# Patient Record
Sex: Female | Born: 1975 | Race: White | Hispanic: No | Marital: Married | Smoking: Never smoker
Health system: Southern US, Community
[De-identification: ages and names within clinical notes are randomized; demographics above are authoritative.]

## PROBLEM LIST (undated history)

## (undated) DIAGNOSIS — Z8619 Personal history of other infectious and parasitic diseases: Secondary | ICD-10-CM

## (undated) DIAGNOSIS — T7840XA Allergy, unspecified, initial encounter: Secondary | ICD-10-CM

## (undated) DIAGNOSIS — F419 Anxiety disorder, unspecified: Secondary | ICD-10-CM

## (undated) HISTORY — DX: Allergy, unspecified, initial encounter: T78.40XA

## (undated) HISTORY — PX: INTRAUTERINE DEVICE (IUD) INSERTION: SHX5877

## (undated) HISTORY — DX: Anxiety disorder, unspecified: F41.9

## (undated) HISTORY — DX: Personal history of other infectious and parasitic diseases: Z86.19

---

## 1997-12-20 HISTORY — PX: WISDOM TOOTH EXTRACTION: SHX21

## 1999-12-21 HISTORY — PX: CHOLECYSTECTOMY: SHX55

## 2012-12-20 LAB — HM PAP SMEAR: HM Pap smear: NEGATIVE

## 2016-03-06 DIAGNOSIS — H60501 Unspecified acute noninfective otitis externa, right ear: Secondary | ICD-10-CM | POA: Diagnosis not present

## 2016-03-06 DIAGNOSIS — J329 Chronic sinusitis, unspecified: Secondary | ICD-10-CM | POA: Diagnosis not present

## 2016-07-07 ENCOUNTER — Telehealth: Payer: Self-pay | Admitting: Family Medicine

## 2016-07-07 MED ORDER — AMOXICILLIN 875 MG PO TABS: 875.0000 mg | ORAL_TABLET | Freq: Two times a day (BID) | ORAL | Status: DC

## 2016-07-07 MED FILL — AMOXICILLIN 875 MG TABLET: 875 | 10 days supply | Qty: 20 | Fill #0

## 2016-07-07 NOTE — Telephone Encounter (Signed)
Pt has hx of recurrent R ear infxns and is again having R ear pain, pressure.  Youngest daughter w/ recent ear infxn.  No drainage from ear.  No fever.  I evaluated pt's R ear prior to clinic today and she does have red, bulging R TM w/ visible purulent fluid collection.  Start Amox.  Reviewed supportive care and red flags that should prompt return.  Pt expressed understanding and is in agreement w/ plan.

## 2016-07-12 DIAGNOSIS — L0211 Cutaneous abscess of neck: Secondary | ICD-10-CM | POA: Diagnosis not present

## 2016-07-12 DIAGNOSIS — L03221 Cellulitis of neck: Secondary | ICD-10-CM | POA: Diagnosis not present

## 2016-07-12 MED FILL — HYDROCODON-APAP 7.5-325: 7.5-325 | 5 days supply | Qty: 30 | Fill #0

## 2016-07-12 MED FILL — CLINDAMYCIN HCL 300 MG CAPS: 300 | 14 days supply | Qty: 42 | Fill #0

## 2016-07-12 MED FILL — FLUCONAZOLE 100 MG TABLET: 100 | 15 days supply | Qty: 5 | Fill #0

## 2016-10-23 DIAGNOSIS — R635 Abnormal weight gain: Secondary | ICD-10-CM | POA: Diagnosis not present

## 2016-10-23 DIAGNOSIS — R632 Polyphagia: Secondary | ICD-10-CM | POA: Diagnosis not present

## 2016-10-31 DIAGNOSIS — B9789 Other viral agents as the cause of diseases classified elsewhere: Secondary | ICD-10-CM | POA: Diagnosis not present

## 2016-10-31 DIAGNOSIS — J069 Acute upper respiratory infection, unspecified: Secondary | ICD-10-CM | POA: Diagnosis not present

## 2016-10-31 DIAGNOSIS — H6981 Other specified disorders of Eustachian tube, right ear: Secondary | ICD-10-CM | POA: Diagnosis not present

## 2017-01-03 ENCOUNTER — Telehealth: Payer: Self-pay | Admitting: Physician Assistant

## 2017-01-03 MED ORDER — AMOXICILLIN 875 MG PO TABS: 875.0000 mg | ORAL_TABLET | Freq: Two times a day (BID) | ORAL | 0 refills | Status: DC

## 2017-01-03 NOTE — Telephone Encounter (Signed)
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 Ear pain x 2 days with pressure. No other symptoms. Denies fever, chills, cough. + history of AOM, twice in past few months, most recently treated by her PCP with Cefdinir.  L TM within normal limits. R TM with erythema, bulging and purulent effusion noted. TM intact.   Rx Amoxicillin sent in.

## 2017-01-13 DIAGNOSIS — J322 Chronic ethmoidal sinusitis: Secondary | ICD-10-CM | POA: Diagnosis not present

## 2017-01-13 DIAGNOSIS — J370 Chronic laryngitis: Secondary | ICD-10-CM | POA: Diagnosis not present

## 2017-01-13 DIAGNOSIS — J039 Acute tonsillitis, unspecified: Secondary | ICD-10-CM | POA: Diagnosis not present

## 2017-01-13 DIAGNOSIS — H6521 Chronic serous otitis media, right ear: Secondary | ICD-10-CM | POA: Diagnosis not present

## 2017-01-13 DIAGNOSIS — J320 Chronic maxillary sinusitis: Secondary | ICD-10-CM | POA: Diagnosis not present

## 2017-01-13 DIAGNOSIS — H7011 Chronic mastoiditis, right ear: Secondary | ICD-10-CM | POA: Diagnosis not present

## 2017-01-13 DIAGNOSIS — H6501 Acute serous otitis media, right ear: Secondary | ICD-10-CM | POA: Diagnosis not present

## 2017-01-13 DIAGNOSIS — H902 Conductive hearing loss, unspecified: Secondary | ICD-10-CM | POA: Diagnosis not present

## 2017-01-13 MED FILL — CLINDAMYCIN HCL 150 MG CAPS: 150 | 10 days supply | Qty: 30 | Fill #0

## 2017-02-03 DIAGNOSIS — J040 Acute laryngitis: Secondary | ICD-10-CM | POA: Diagnosis not present

## 2017-02-03 DIAGNOSIS — J322 Chronic ethmoidal sinusitis: Secondary | ICD-10-CM | POA: Diagnosis not present

## 2017-02-03 DIAGNOSIS — J320 Chronic maxillary sinusitis: Secondary | ICD-10-CM | POA: Diagnosis not present

## 2017-02-03 DIAGNOSIS — H6521 Chronic serous otitis media, right ear: Secondary | ICD-10-CM | POA: Diagnosis not present

## 2017-04-21 DIAGNOSIS — R635 Abnormal weight gain: Secondary | ICD-10-CM | POA: Diagnosis not present

## 2017-04-28 DIAGNOSIS — Z01419 Encounter for gynecological examination (general) (routine) without abnormal findings: Secondary | ICD-10-CM | POA: Diagnosis not present

## 2017-04-28 DIAGNOSIS — E66811 Obesity, class 1: Secondary | ICD-10-CM | POA: Insufficient documentation

## 2017-04-28 DIAGNOSIS — E669 Obesity, unspecified: Secondary | ICD-10-CM | POA: Insufficient documentation

## 2017-08-26 ENCOUNTER — Ambulatory Visit (INDEPENDENT_AMBULATORY_CARE_PROVIDER_SITE_OTHER): Payer: 59

## 2017-08-26 DIAGNOSIS — Z23 Encounter for immunization: Secondary | ICD-10-CM | POA: Diagnosis not present

## 2017-12-28 ENCOUNTER — Ambulatory Visit (INDEPENDENT_AMBULATORY_CARE_PROVIDER_SITE_OTHER): Payer: No Typology Code available for payment source | Admitting: Physician Assistant

## 2017-12-28 ENCOUNTER — Encounter: Payer: Self-pay | Admitting: Physician Assistant

## 2017-12-28 VITALS — BP 110/84 | HR 105 | Temp 98.3°F | Resp 14 | Ht 69.0 in | Wt 235.0 lb

## 2017-12-28 DIAGNOSIS — B9689 Other specified bacterial agents as the cause of diseases classified elsewhere: Secondary | ICD-10-CM

## 2017-12-28 DIAGNOSIS — J019 Acute sinusitis, unspecified: Secondary | ICD-10-CM

## 2017-12-28 MED ORDER — BECLOMETHASONE DIPROPIONATE 80 MCG/ACT NA AERS: INHALATION_SPRAY | NASAL | 0 refills | Status: DC

## 2017-12-28 MED ORDER — DOXYCYCLINE HYCLATE 100 MG PO CAPS: 100.0000 mg | ORAL_CAPSULE | Freq: Two times a day (BID) | ORAL | 0 refills | Status: DC

## 2017-12-28 MED FILL — DOXYCYCLINE HYCLATE 100 MG: 100 | 10 days supply | Qty: 20 | Fill #0

## 2017-12-28 NOTE — Patient Instructions (Signed)
Please take antibiotic as directed.  Increase fluid intake.  Use Saline nasal spray.  Take a daily multivitamin. Restart qnasl. Stop the Afrin.  Place a humidifier in the bedroom.  Please call or return clinic if symptoms are not improving.  Sinusitis Sinusitis is redness, soreness, and swelling (inflammation) of the paranasal sinuses. Paranasal sinuses are air pockets within the bones of your face (beneath the eyes, the middle of the forehead, or above the eyes). In healthy paranasal sinuses, mucus is able to drain out, and air is able to circulate through them by way of your nose. However, when your paranasal sinuses are inflamed, mucus and air can become trapped. This can allow bacteria and other germs to grow and cause infection. Sinusitis can develop quickly and last only a short time (acute) or continue over a long period (chronic). Sinusitis that lasts for more than 12 weeks is considered chronic.  CAUSES  Causes of sinusitis include:  Allergies.  Structural abnormalities, such as displacement of the cartilage that separates your nostrils (deviated septum), which can decrease the air flow through your nose and sinuses and affect sinus drainage.  Functional abnormalities, such as when the small hairs (cilia) that line your sinuses and help remove mucus do not work properly or are not present. SYMPTOMS  Symptoms of acute and chronic sinusitis are the same. The primary symptoms are pain and pressure around the affected sinuses. Other symptoms include:  Upper toothache.  Earache.  Headache.  Bad breath.  Decreased sense of smell and taste.  A cough, which worsens when you are lying flat.  Fatigue.  Fever.  Thick drainage from your nose, which often is green and may contain pus (purulent).  Swelling and warmth over the affected sinuses. DIAGNOSIS  Your caregiver will perform a physical exam. During the exam, your caregiver may:  Look in your nose for signs of abnormal growths  in your nostrils (nasal polyps).  Tap over the affected sinus to check for signs of infection.  View the inside of your sinuses (endoscopy) with a special imaging device with a light attached (endoscope), which is inserted into your sinuses. If your caregiver suspects that you have chronic sinusitis, one or more of the following tests may be recommended:  Allergy tests.  Nasal culture A sample of mucus is taken from your nose and sent to a lab and screened for bacteria.  Nasal cytology A sample of mucus is taken from your nose and examined by your caregiver to determine if your sinusitis is related to an allergy. TREATMENT  Most cases of acute sinusitis are related to a viral infection and will resolve on their own within 10 days. Sometimes medicines are prescribed to help relieve symptoms (pain medicine, decongestants, nasal steroid sprays, or saline sprays).  However, for sinusitis related to a bacterial infection, your caregiver will prescribe antibiotic medicines. These are medicines that will help kill the bacteria causing the infection.  Rarely, sinusitis is caused by a fungal infection. In theses cases, your caregiver will prescribe antifungal medicine. For some cases of chronic sinusitis, surgery is needed. Generally, these are cases in which sinusitis recurs more than 3 times per year, despite other treatments. HOME CARE INSTRUCTIONS   Drink plenty of water. Water helps thin the mucus so your sinuses can drain more easily.  Use a humidifier.  Inhale steam 3 to 4 times a day (for example, sit in the bathroom with the shower running).  Apply a warm, moist washcloth to your face 3  to 4 times a day, or as directed by your caregiver.  Use saline nasal sprays to help moisten and clean your sinuses.  Take over-the-counter or prescription medicines for pain, discomfort, or fever only as directed by your caregiver. SEEK IMMEDIATE MEDICAL CARE IF:  You have increasing pain or severe  headaches.  You have nausea, vomiting, or drowsiness.  You have swelling around your face.  You have vision problems.  You have a stiff neck.  You have difficulty breathing. MAKE SURE YOU:   Understand these instructions.  Will watch your condition.  Will get help right away if you are not doing well or get worse. Document Released: 12/06/2005 Document Revised: 02/28/2012 Document Reviewed: 12/21/2011 [facility] Patient Information 2014 [place], [place].

## 2017-12-28 NOTE — Progress Notes (Signed)
   Patient presents to clinic today c/o > 1 week of sinus pressure/sinus pain (maxillary), post nasal drip and ear pressure. Denies ear pain. Denies fever. Has been taking Mucinex and Afrin to help with symptoms. Denies recent travel or sick contact. Sinus pain worsening over past 48 hours.   Past Medical History:  Diagnosis Date  . History of chickenpox     Current Outpatient Medications on File Prior to Visit  Medication Sig Dispense Refill  . phentermine (ADIPEX-P) 37.5 MG tablet TAKE ONE TABLET BY MOUTH DAILY BEFORE BREAKFAST     No current facility-administered medications on file prior to visit.     No Known Allergies  Family History  Problem Relation Age of Onset  . Hyperlipidemia Mother   . Hypertension Mother   . Diabetes Father   . Cancer Maternal Aunt        Breast  . Diabetes Maternal Grandmother     Social History   Socioeconomic History  . Marital status: Married    Spouse name: Alinda Moneyony  . Number of children: 4  . Years of education: None  . Highest education level: None  Social Needs  . Financial resource strain: None  . Food insecurity - worry: None  . Food insecurity - inability: None  . Transportation needs - medical: None  . Transportation needs - non-medical: None  Occupational History  . Occupation: Magazine features editorront Desk    Employer: [employer]  Tobacco Use  . Smoking status: Never Smoker  . Smokeless tobacco: Never Used  Substance and Sexual Activity  . Alcohol use: Yes  . Drug use: No  . Sexual activity: Yes  Other Topics Concern  . None  Social History Narrative  . None   Review of Systems - See HPI.  All other ROS are negative.  BP 110/84   Pulse (!) 105   Temp 98.3 F (36.8 C) (Oral)   Resp 14   Ht 5\' 9"  (1.753 m)   Wt 235 lb (106.6 kg)   SpO2 99%   BMI 34.70 kg/m   Physical Exam  Constitutional: She is oriented to person, place, and time and well-developed, well-nourished, and in no distress.  HENT:  Head: Normocephalic and  atraumatic.  Right Ear: Tympanic membrane normal.  Left Ear: Tympanic membrane normal.  Nose: Right sinus exhibits maxillary sinus tenderness. Right sinus exhibits no frontal sinus tenderness. Left sinus exhibits maxillary sinus tenderness. Left sinus exhibits no frontal sinus tenderness.  Mouth/Throat: Uvula is midline, oropharynx is clear and moist and mucous membranes are normal.  Eyes: Conjunctivae are normal.  Neck: Neck supple.  Cardiovascular: Normal rate, regular rhythm, normal heart sounds and intact distal pulses.  Pulmonary/Chest: Breath sounds normal. No respiratory distress. She has no wheezes. She has no rales. She exhibits no tenderness.  Neurological: She is alert and oriented to person, place, and time.  Skin: Skin is warm and dry. No rash noted.  Psychiatric: Affect normal.  Vitals reviewed.  Assessment/Plan: 1. Acute bacterial sinusitis Rx Doxycycline.  Increase fluids.  Rest.  Saline nasal spray.  Probiotic.  Mucinex as directed.  Humidifier in bedroom. Rx Qnasl.  Call or return to clinic if symptoms are not improving.  - doxycycline (VIBRAMYCIN) 100 MG capsule; Take 1 capsule (100 mg total) by mouth 2 (two) times daily.  Dispense: 20 capsule; Refill: 0 - Beclomethasone Dipropionate 80 MCG/ACT AERS; 1-2 spray in each nostril daily.  Dispense: 8.7 g; Refill: 0   Piedad ClimesWilliam Cody Lakeishia Truluck, PA-C

## 2017-12-28 NOTE — Addendum Note (Signed)
Addended by: Waldon MerlMARTIN, Kensleigh Gates C on: 12/28/2017 10:13 AM   Modules accepted: Orders

## 2018-01-12 ENCOUNTER — Encounter: Payer: Self-pay | Admitting: Family Medicine

## 2018-01-12 ENCOUNTER — Ambulatory Visit (INDEPENDENT_AMBULATORY_CARE_PROVIDER_SITE_OTHER): Payer: No Typology Code available for payment source | Admitting: Family Medicine

## 2018-01-12 VITALS — BP 120/82 | HR 82 | Temp 98.1°F | Ht 70.0 in | Wt 234.0 lb

## 2018-01-12 DIAGNOSIS — J301 Allergic rhinitis due to pollen: Secondary | ICD-10-CM | POA: Diagnosis not present

## 2018-01-12 DIAGNOSIS — E669 Obesity, unspecified: Secondary | ICD-10-CM

## 2018-01-12 DIAGNOSIS — Z0000 Encounter for general adult medical examination without abnormal findings: Secondary | ICD-10-CM | POA: Diagnosis not present

## 2018-01-12 DIAGNOSIS — S0300XA Dislocation of jaw, unspecified side, initial encounter: Secondary | ICD-10-CM | POA: Insufficient documentation

## 2018-01-12 DIAGNOSIS — H6980 Other specified disorders of Eustachian tube, unspecified ear: Secondary | ICD-10-CM | POA: Insufficient documentation

## 2018-01-12 LAB — COMPREHENSIVE METABOLIC PANEL
ALT: 23 U/L (ref 0–35)
AST: 17 U/L (ref 0–37)
Albumin: 4.5 g/dL (ref 3.5–5.2)
Alkaline Phosphatase: 53 U/L (ref 39–117)
BUN: 13 mg/dL (ref 6–23)
CO2: 29 meq/L (ref 19–32)
Calcium: 9.5 mg/dL (ref 8.4–10.5)
Chloride: 102 mEq/L (ref 96–112)
Creatinine, Ser: 0.74 mg/dL (ref 0.40–1.20)
GFR: 91.5 mL/min (ref >60.00)
GLUCOSE: 108 mg/dL — AB (ref 70–99)
POTASSIUM: 3.8 meq/L (ref 3.5–5.1)
SODIUM: 139 meq/L (ref 135–145)
TOTAL PROTEIN: 7 g/dL (ref 6.0–8.3)
Total Bilirubin: 0.4 mg/dL (ref 0.2–1.2)

## 2018-01-12 LAB — CBC WITH DIFFERENTIAL/PLATELET
BASOS ABS: 0 10*3/uL (ref 0.0–0.1)
Basophils Relative: 0.1 % (ref 0.0–3.0)
EOS PCT: 2.2 % (ref 0.0–5.0)
Eosinophils Absolute: 0.2 10*3/uL (ref 0.0–0.7)
HCT: 40.4 % (ref 36.0–46.0)
Hemoglobin: 13.6 g/dL (ref 12.0–15.0)
LYMPHS ABS: 2.6 10*3/uL (ref 0.7–4.0)
Lymphocytes Relative: 28.6 % (ref 12.0–46.0)
MCHC: 33.6 g/dL (ref 30.0–36.0)
MCV: 87.7 fl (ref 78.0–100.0)
MONO ABS: 0.6 10*3/uL (ref 0.1–1.0)
MONOS PCT: 6.8 % (ref 3.0–12.0)
NEUTROS ABS: 5.7 10*3/uL (ref 1.4–7.7)
NEUTROS PCT: 62.3 % (ref 43.0–77.0)
PLATELETS: 346 10*3/uL (ref 150.0–400.0)
RBC: 4.6 Mil/uL (ref 3.87–5.11)
RDW: 12.8 % (ref 11.5–15.5)
WBC: 9.2 10*3/uL (ref 4.0–10.5)

## 2018-01-12 LAB — LDL CHOLESTEROL, DIRECT: LDL DIRECT: 131 mg/dL

## 2018-01-12 LAB — LIPID PANEL
CHOL/HDL RATIO: 6
CHOLESTEROL: 236 mg/dL — AB (ref 0–200)
HDL: 37.9 mg/dL — ABNORMAL LOW (ref >39.00)
Triglycerides: 535 mg/dL — ABNORMAL HIGH (ref 0.0–149.0)

## 2018-01-12 NOTE — Progress Notes (Signed)
Subjective  Chief Complaint  Patient presents with  . Establish Care  . Annual Exam    Sees Gynecology for Paps     HPI: Rachel Curtis is a 42 y.o. female who [facility] today for a Female Wellness Visit.   Wellness Visit: annual visit with health maintenance review and exam without Pap   Healthy married G4P4 here for annual exam. No concerns. Has ETD, AR and occasional sinusitis. Working on losing weight; has phentermine for appetite suppression. No acute concerns or needs.   Lifestyle: Body mass index is 33.58 kg/m. Wt Readings from Last 3 Encounters:  01/12/18 234 lb (106.1 kg)  12/28/17 235 lb (106.6 kg)   Diet: general Exercise: rarely, walking Need for contraception: No, vasectomy (husband)  Patient Active Problem List   Diagnosis Date Noted  . Allergic rhinitis due to pollen 01/12/2018  . Eustachian tube dysfunction 01/12/2018  . TMJ (dislocation of temporomandibular joint) 01/12/2018  . Obesity (BMI 30.0-34.9) 04/28/2017   Health Maintenance  Topic Date Due  . HIV Screening  12/28/2018 (Originally 02/02/1991)  . PAP SMEAR  05/03/2020  . TETANUS/TDAP  12/02/2024  . INFLUENZA VACCINE  Completed   Immunization History  Administered Date(s) Administered  . Influenza,inj,Quad PF,6+ Mos 08/22/2017, 08/26/2017  . Influenza-Unspecified 09/03/2016  . Tdap 12/02/2014   We updated and reviewed the patient's past history in detail and it is documented below. Allergies: Patient has No Known Allergies. Past Medical History Patient  has a past medical history of Allergy and History of chickenpox. Past Surgical History Patient  has a past surgical history that includes Wisdom tooth extraction (1999) and Cholecystectomy (2001). Family History: Patient family history includes Alcohol abuse in her paternal grandfather; Arthritis in her father; Breast cancer in her maternal aunt; Dementia in her paternal grandmother; Diabetes  in her father and maternal grandmother; Heart disease in her father; Hyperlipidemia in her mother; Hypertension in her mother. Social History:  Patient  reports that  has never smoked. she has never used smokeless tobacco. She reports that she drinks alcohol. She reports that she does not use drugs.  Review of Systems: Constitutional: negative for fever or malaise Ophthalmic: negative for photophobia, double vision or loss of vision Cardiovascular: negative for chest pain, dyspnea on exertion, or new LE swelling Respiratory: negative for SOB or persistent cough Gastrointestinal: negative for abdominal pain, change in bowel habits or melena Genitourinary: negative for dysuria or gross hematuria, no abnormal uterine bleeding or disharge Musculoskeletal: negative for new gait disturbance or muscular weakness Integumentary: negative for new or persistent rashes, no breast lumps Neurological: negative for TIA or stroke symptoms Psychiatric: negative for SI or delusions Allergic/Immunologic: negative for hives Patient Care Team    Relationship Specialty Notifications Start End  Willow OraAndy, Laylani Pudwill L, MD PCP - General Family Medicine  12/28/17     Objective  Vitals: BP 120/82 (BP Location: Left Arm, Patient Position: Sitting, Cuff Size: Large)   Pulse 82   Temp 98.1 F (36.7 C) (Oral)   Ht 5\' 10"  (1.778 m)   Wt 234 lb (106.1 kg)   SpO2 97%   BMI 33.58 kg/m  General:  Well developed, well nourished, no acute distress  Psych:  Alert and orientedx3,normal mood and affect HEENT:  Normocephalic, atraumatic, non-icteric sclera, PERRL, oropharynx is clear without mass or exudate, supple neck without adenopathy, mass or thyromegaly Cardiovascular:  Normal S1, S2, RRR without gallop, rub or murmur, nondisplaced PMI Respiratory:  Good breath  sounds bilaterally, CTAB with normal respiratory effort Gastrointestinal: normal bowel sounds, soft, non-tender, no noted masses. No HSM MSK: no deformities,  contusions. Joints are without erythema or swelling. Spine and CVA region are nontender Skin:  Warm, no rashes or suspicious lesions noted Neurologic:    Mental status is normal. CN 2-11 are normal. Gross motor and sensory exams are normal. Normal gait. No tremor  Assessment  1. Annual physical exam   2. Seasonal allergic rhinitis due to pollen   3. Obesity (BMI 30.0-34.9)      Plan  Female Wellness Visit:  Age appropriate Health Maintenance and Prevention measures were discussed with patient. Included topics are cancer screening recommendations, ways to keep healthy (see AVS) including dietary and exercise recommendations, regular eye and dental care, use of seat belts, and avoidance of moderate alcohol use and tobacco use.   BMI: discussed patient's BMI and encouraged positive lifestyle modifications to help get to or maintain a target BMI. Discussed diet and appropriate use of phentermine.  HM needs and immunizations were addressed and ordered. See below for orders. See HM and immunization section for updates.  Routine labs and screening tests ordered including cmp, cbc and lipids where appropriate.  Discussed recommendations regarding Vit D and calcium supplementation (see AVS)  Follow up: Return in about 12 months (around 01/12/2019) for complete physical.    Commons side effects, risks, benefits, and alternatives for medications and treatment plan prescribed today were discussed, and the patient expressed understanding of the given instructions. Patient is instructed to call or message via MyChart if he/she has any questions or concerns regarding our treatment plan. No barriers to understanding were identified. We discussed Red Flag symptoms and signs in detail. Patient expressed understanding regarding what to do in case of urgent or emergency type symptoms.   Medication list was reconciled, printed and provided to the patient in AVS. Patient instructions and summary information was  reviewed with the patient as documented in the AVS. This note was prepared with assistance of Dragon voice recognition software. Occasional wrong-word or sound-a-like substitutions may have occurred due to the inherent limitations of voice recognition software  Orders Placed This Encounter  Procedures  . CBC with Differential/Platelet  . Comprehensive metabolic panel  . Lipid panel   No orders of the defined types were placed in this encounter.

## 2018-01-12 NOTE — Patient Instructions (Signed)
Please return in 12 months for your complete physical.  It was a pleasure meeting you today! Thank you for choosing us to meet your healthcare needs! I truly look forward to working with you. If you have any questions or concerns, please send me a message via Mychart or call the office at [phone]. Please do these things to maintain good health!   Exercise at least 30-45 minutes a day,  4-5 days a week.   Eat a low-fat diet with lots of fruits and vegetables, up to 7-9 servings per day.  Drink plenty of water daily. Try to drink 8 8oz glasses per day.  Seatbelts can save your life. Always wear your seatbelt.  Place Smoke Detectors on every level of your home and check batteries every year.  Schedule an appointment with an eye doctor for an eye exam every 1-2 years  Safe sex - use condoms to protect yourself from STDs if you could be exposed to these types of infections. Use birth control if you do not want to become pregnant and are sexually active.  Avoid heavy alcohol use. If you drink, keep it to less than 2 drinks/day and not every day.  Health Care Power of Attorney.  Choose someone you trust that could speak for you if you became unable to speak for yourself.  Depression is common in our stressful world.If you're feeling down or losing interest in things you normally enjoy, please come in for a visit.  If anyone is threatening or hurting you, please get help. Physical or Emotional Violence is never OK.

## 2018-01-13 ENCOUNTER — Other Ambulatory Visit: Payer: Self-pay | Admitting: Family Medicine

## 2018-01-13 DIAGNOSIS — E781 Pure hyperglyceridemia: Secondary | ICD-10-CM

## 2018-01-13 NOTE — Progress Notes (Signed)
Labs reviewed. Discussed labs with patient. rec low fat diet and fish oil twice a day. Increase physical activity to increase HDL and for weight loss. Recheck lipids fasting in 6-12 weeks.

## 2018-02-23 ENCOUNTER — Other Ambulatory Visit: Payer: Self-pay

## 2018-02-23 ENCOUNTER — Encounter: Payer: Self-pay | Admitting: Family Medicine

## 2018-02-23 ENCOUNTER — Ambulatory Visit (INDEPENDENT_AMBULATORY_CARE_PROVIDER_SITE_OTHER): Payer: No Typology Code available for payment source | Admitting: Family Medicine

## 2018-02-23 VITALS — BP 120/82 | HR 93 | Temp 98.6°F | Resp 16 | Ht 70.0 in | Wt 234.0 lb

## 2018-02-23 DIAGNOSIS — F430 Acute stress reaction: Secondary | ICD-10-CM

## 2018-02-23 NOTE — Patient Instructions (Signed)
Please return in 2 weeks for recheck.   If you have any questions or concerns, please don't hesitate to send me a message via MyChart or call the office at [phone]. Thank you for visiting with us today! It's our pleasure caring for you.

## 2018-02-24 ENCOUNTER — Encounter: Payer: Self-pay | Admitting: Family Medicine

## 2018-02-24 NOTE — Progress Notes (Signed)
Subjective  CC:  Chief Complaint  Patient presents with  . Stress    increased stress at work and home. Worsened over the last 2 months    HPI: Rachel Curtis is a 42 y.o. female who presents to the office today to address the problems listed above in the chief complaint.  Very stressed. Dreading work. Having problems with handling changes and mistakes. Home life is stressful as well with young adult daughter issues and husband with OCD like tendencies. She feels like she as no down time. Feels stressed, irritable and anxious, but sleeping well, functioning well, w/o depression or panic. Has remote h/o depression on ssri; not certain she needs meds at this time.   I reviewed the patients updated PMH, FH, and SocHx.    Patient Active Problem List   Diagnosis Date Noted  . Allergic rhinitis due to pollen 01/12/2018  . Eustachian tube dysfunction 01/12/2018  . TMJ (dislocation of temporomandibular joint) 01/12/2018  . Obesity (BMI 30.0-34.9) 04/28/2017   Current Meds  Medication Sig  . fluticasone (FLONASE) 50 MCG/ACT nasal spray Place 2 sprays into both nostrils daily.  . phentermine (ADIPEX-P) 37.5 MG tablet TAKE ONE TABLET BY MOUTH DAILY BEFORE BREAKFAST    Allergies: Patient has No Known Allergies. Family History: Patient family history includes Alcohol abuse in her paternal grandfather; Arthritis in her father; Breast cancer in her maternal aunt; Dementia in her paternal grandmother; Diabetes in her father and maternal grandmother; Heart disease in her father; Hyperlipidemia in her mother; Hypertension in her mother. Social History:  Patient  reports that  has never smoked. she has never used smokeless tobacco. She reports that she drinks alcohol. She reports that she does not use drugs.  Review of Systems: Constitutional: Negative for fever malaise or anorexia Cardiovascular: negative for chest pain Respiratory: negative for SOB or persistent cough Gastrointestinal:  negative for abdominal pain  Objective  Vitals: BP 120/82   Pulse 93   Temp 98.6 F (37 C) (Oral)   Resp 16   Ht 5\' 10"  (1.778 m)   Wt 234 lb (106.1 kg)   SpO2 98%   BMI 33.58 kg/m  General: no acute distress , A&Ox3 Appears stressed but appropriate affect and insight  Assessment  1. Stress reaction      Plan   Stress reaction:  Today's visit was 15 minutes long. Greater than 50% of this time was devoted to face to face counseling with the patient and coordination of care. We discussed her diagnosis, prognosis, treatment options and ways of managing stressors. Consider counseling to get help with home life stressors. Will make adjustments, then reevaluate need for meds in 2 weeks.    Follow up: 2 weeks for recheck mood    Commons side effects, risks, benefits, and alternatives for medications and treatment plan prescribed today were discussed, and the patient expressed understanding of the given instructions. Patient is instructed to call or message via MyChart if he/she has any questions or concerns regarding our treatment plan. No barriers to understanding were identified. We discussed Red Flag symptoms and signs in detail. Patient expressed understanding regarding what to do in case of urgent or emergency type symptoms.   Medication list was reconciled, printed and provided to the patient in AVS. Patient instructions and summary information was reviewed with the patient as documented in the AVS. This note was prepared with assistance of Dragon voice recognition software. Occasional wrong-word or sound-a-like substitutions may have occurred due to the inherent  limitations of voice recognition software  No orders of the defined types were placed in this encounter.  No orders of the defined types were placed in this encounter.

## 2018-07-20 ENCOUNTER — Encounter: Payer: Self-pay | Admitting: Family Medicine

## 2018-07-21 MED ORDER — PHENTERMINE HCL 37.5 MG PO TABS: ORAL_TABLET | ORAL | 1 refills | Status: DC

## 2018-07-28 ENCOUNTER — Other Ambulatory Visit: Payer: Self-pay | Admitting: Emergency Medicine

## 2018-07-28 MED ORDER — PHENTERMINE HCL 37.5 MG PO TABS: ORAL_TABLET | ORAL | 1 refills | Status: DC

## 2018-07-28 MED ORDER — PHENTERMINE HCL 37.5 MG PO TABS: 37.5000 mg | ORAL_TABLET | Freq: Every day | ORAL | 5 refills | Status: DC

## 2018-07-28 NOTE — Telephone Encounter (Signed)
Prescription sent to wrong Pharm[facility]. [facility].   Kathi SimpersAmy Cameo Shewell,  LPN

## 2018-07-28 NOTE — Addendum Note (Signed)
Addended by: Asencion PartridgeANDY, Audianna Landgren on: 07/28/2018 03:56 PM   Modules accepted: Orders

## 2018-07-28 NOTE — Telephone Encounter (Signed)
Refilled at [facility]

## 2018-08-02 ENCOUNTER — Other Ambulatory Visit (INDEPENDENT_AMBULATORY_CARE_PROVIDER_SITE_OTHER): Payer: No Typology Code available for payment source

## 2018-08-02 DIAGNOSIS — E781 Pure hyperglyceridemia: Secondary | ICD-10-CM

## 2018-08-02 LAB — LIPID PANEL
CHOLESTEROL: 249 mg/dL — AB (ref 0–200)
HDL: 43.8 mg/dL (ref >39.00)
NonHDL: 204.89
TRIGLYCERIDES: 341 mg/dL — AB (ref 0.0–149.0)
Total CHOL/HDL Ratio: 6
VLDL: 68.2 mg/dL — ABNORMAL HIGH (ref 0.0–40.0)

## 2018-08-02 LAB — LDL CHOLESTEROL, DIRECT: Direct LDL: 146 mg/dL

## 2018-10-25 ENCOUNTER — Encounter: Payer: Self-pay | Admitting: Family Medicine

## 2018-10-25 MED ORDER — AMOXICILLIN-POT CLAVULANATE 875-125 MG PO TABS: 1.0000 | ORAL_TABLET | Freq: Two times a day (BID) | ORAL | 0 refills | Status: DC

## 2019-02-20 ENCOUNTER — Ambulatory Visit (INDEPENDENT_AMBULATORY_CARE_PROVIDER_SITE_OTHER): Payer: No Typology Code available for payment source | Admitting: Family Medicine

## 2019-02-20 ENCOUNTER — Other Ambulatory Visit: Payer: Self-pay

## 2019-02-20 ENCOUNTER — Encounter: Payer: Self-pay | Admitting: Family Medicine

## 2019-02-20 VITALS — BP 124/86 | HR 98 | Temp 98.3°F | Resp 16 | Ht 70.0 in | Wt 235.0 lb

## 2019-02-20 DIAGNOSIS — L304 Erythema intertrigo: Secondary | ICD-10-CM | POA: Diagnosis not present

## 2019-02-20 MED ORDER — NYSTATIN 100000 UNIT/GM EX POWD: Freq: Three times a day (TID) | CUTANEOUS | 0 refills | Status: DC | PRN

## 2019-02-20 NOTE — Progress Notes (Signed)
Subjective  CC:  Chief Complaint  Patient presents with  . Rash    Noticed over the weekend, lower abdomen.. Red, itchy, and has odor.    HPI: Rachel Curtis is a 43 y.o. female who presents to the office today to address the problems listed above in the chief complaint.  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  Patient is a G4, P4.  She has dealt with recurrent red itching rashes, especially when she engages his physical activity.  She restarted exercise over the last 2 months.  Now presents with red irritated rash with white discharge that has an odor.  Affected area is mainly along her C-section scar.  No associated symptoms.  She is interested in pursuing penectomy in the future.  Assessment  1. Intertrigo      Plan   Intertrigo: Nystatin powder to be used as needed.  Discussed ways to keep area clean and dry even during exercise.  May warrant surgical referral if persist.  Patient is actively engaging in behavioral change for weight loss.  Encouraged her to continue this.  Will monitor over the next 3 to 6 months.  Follow up: No follow-ups on file.  Visit date not found  No orders of the defined types were placed in this encounter.  Meds ordered this encounter  Medications  . nystatin (MYCOSTATIN/NYSTOP) powder    Sig: Apply topically 3 (three) times daily as needed.    Dispense:  45 g    Refill:  0      I reviewed the patients updated PMH, FH, and SocHx.    Patient Active Problem List   Diagnosis Date Noted  . Allergic rhinitis due to pollen 01/12/2018  . Eustachian tube dysfunction 01/12/2018  . TMJ (dislocation of temporomandibular joint) 01/12/2018  . Obesity (BMI 30.0-34.9) 04/28/2017   Current Meds  Medication Sig  . fluticasone (FLONASE) 50 MCG/ACT nasal spray Place 2 sprays into both nostrils daily.  . phentermine (ADIPEX-P) 37.5 MG tablet Take 1 tablet (37.5 mg total) by mouth daily before breakfast.     Allergies: Patient has No Known Allergies. Family History: Patient family history includes Alcohol abuse in her paternal grandfather; Arthritis in her father; Breast cancer in her maternal aunt; Dementia in her paternal grandmother; Diabetes in her father and maternal grandmother; Heart disease in her father; Hyperlipidemia in her mother; Hypertension in her mother. Social History:  Patient  reports that she has never smoked. She has never used smokeless tobacco. She reports current alcohol use. She reports that she does not use drugs.  Review of Systems: Constitutional: Negative for fever malaise or anorexia Cardiovascular: negative for chest pain Respiratory: negative for SOB or persistent cough Gastrointestinal: negative for abdominal pain  Objective  Vitals: BP 124/86   Pulse 98   Temp 98.3 F (36.8 C) (Oral)   Resp 16   Ht 5\' 10"  (1.778 m)   Wt 235 lb (106.6 kg)   LMP 02/20/2019   SpO2 97%   BMI 33.72 kg/m  General: no acute distress , A&Ox3 Overhanging pannus present Skin:  Warm, papules over mons pubis, erythema and white flaking over C-section scar     Commons side effects, risks, benefits, and alternatives for medications and treatment plan prescribed today were discussed, and the patient expressed understanding of the given instructions. Patient is instructed to call or message via MyChart if he/she has any questions or concerns regarding our treatment plan. No barriers to understanding  were identified. We discussed Red Flag symptoms and signs in detail. Patient expressed understanding regarding what to do in case of urgent or emergency type symptoms.   Medication list was reconciled, printed and provided to the patient in AVS. Patient instructions and summary information was reviewed with the patient as documented in the AVS. This note was prepared with assistance of Dragon voice recognition software. Occasional wrong-word or sound-a-like substitutions may have  occurred due to the inherent limitations of voice recognition software

## 2019-02-20 NOTE — Patient Instructions (Signed)
Please follow up if symptoms do not improve or as needed.   

## 2019-03-09 ENCOUNTER — Other Ambulatory Visit: Payer: Self-pay | Admitting: Physician Assistant

## 2019-03-09 MED ORDER — AMOXICILLIN-POT CLAVULANATE 875-125 MG PO TABS: 1.0000 | ORAL_TABLET | Freq: Two times a day (BID) | ORAL | 0 refills | Status: DC

## 2019-04-11 ENCOUNTER — Encounter: Payer: Self-pay | Admitting: Family Medicine

## 2019-04-12 MED ORDER — PHENTERMINE HCL 37.5 MG PO TABS: 37.5000 mg | ORAL_TABLET | Freq: Every day | ORAL | 2 refills | Status: DC

## 2019-05-22 ENCOUNTER — Encounter: Payer: No Typology Code available for payment source | Admitting: Family Medicine

## 2019-06-01 ENCOUNTER — Encounter: Payer: Self-pay | Admitting: Family Medicine

## 2019-06-01 ENCOUNTER — Other Ambulatory Visit: Payer: Self-pay

## 2019-06-01 ENCOUNTER — Ambulatory Visit (INDEPENDENT_AMBULATORY_CARE_PROVIDER_SITE_OTHER): Payer: No Typology Code available for payment source | Admitting: Family Medicine

## 2019-06-01 ENCOUNTER — Encounter: Payer: No Typology Code available for payment source | Admitting: Family Medicine

## 2019-06-01 VITALS — BP 126/84 | HR 78 | Temp 98.0°F | Resp 16 | Ht 70.0 in | Wt 233.6 lb

## 2019-06-01 DIAGNOSIS — E669 Obesity, unspecified: Secondary | ICD-10-CM

## 2019-06-01 DIAGNOSIS — F430 Acute stress reaction: Secondary | ICD-10-CM

## 2019-06-01 DIAGNOSIS — Z0000 Encounter for general adult medical examination without abnormal findings: Secondary | ICD-10-CM

## 2019-06-01 MED ORDER — ESCITALOPRAM OXALATE 10 MG PO TABS: 10.0000 mg | ORAL_TABLET | Freq: Every day | ORAL | 2 refills | Status: DC

## 2019-06-01 MED ORDER — ALPRAZOLAM 0.5 MG PO TABS: 0.5000 mg | ORAL_TABLET | Freq: Every day | ORAL | 0 refills | Status: DC | PRN

## 2019-06-01 MED FILL — ALPRAZolam 0.5 MG TABS: 0.5 | 30 days supply | Qty: 30 | Fill #0

## 2019-06-01 MED FILL — ESCITALOPRAM 10 MG TABLET: 10 | 30 days supply | Qty: 30 | Fill #0

## 2019-06-01 NOTE — Patient Instructions (Addendum)
Please return in 2-3 months to recheck anxiety.   If you have any questions or concerns, please don't hesitate to send me a message via MyChart or call the office at [phone]. Thank you for visiting with [place] today! It's our pleasure caring for you.    Preventive Care 40-64 Years, Female Preventive care refers to lifestyle choices and visits with your health care provider that can promote health and wellness. What does preventive care include?   A yearly physical exam. This is also called an annual well check.  Dental exams once or twice a year.  Routine eye exams. Ask your health care provider how often you should have your eyes checked.  Personal lifestyle choices, including: ? Daily care of your teeth and gums. ? Regular physical activity. ? Eating a healthy diet. ? Avoiding tobacco and drug use. ? Limiting alcohol use. ? Practicing safe sex. ? Taking low-dose aspirin daily starting at age 14. ? Taking vitamin and mineral supplements as recommended by your health care provider. What happens during an annual well check? The services and screenings done by your health care provider during your annual well check will depend on your age, overall health, lifestyle risk factors, and family history of disease. Counseling Your health care provider may ask you questions about your:  Alcohol use.  Tobacco use.  Drug use.  Emotional well-being.  Home and relationship well-being.  Sexual activity.  Eating habits.  Work and work Statistician.  Method of birth control.  Menstrual cycle.  Pregnancy history. Screening You may have the following tests or measurements:  Height, weight, and BMI.  Blood pressure.  Lipid and cholesterol levels. These may be checked every 5 years, or more frequently if you are over 55 years old.  Skin check.  Lung cancer screening. You may have this screening every year starting at age 72 if you have a 30-pack-year history of smoking and  currently smoke or have quit within the past 15 years.  Colorectal cancer screening. All adults should have this screening starting at age 69 and continuing until age 75. Your health care provider may recommend screening at age 51. You will have tests every 1-10 years, depending on your results and the type of screening test. People at increased risk should start screening at an earlier age. Screening tests may include: ? Guaiac-based fecal occult blood testing. ? Fecal immunochemical test (FIT). ? Stool DNA test. ? Virtual colonoscopy. ? Sigmoidoscopy. During this test, a flexible tube with a tiny camera (sigmoidoscope) is used to examine your rectum and lower colon. The sigmoidoscope is inserted through your anus into your rectum and lower colon. ? Colonoscopy. During this test, a long, thin, flexible tube with a tiny camera (colonoscope) is used to examine your entire colon and rectum.  Hepatitis C blood test.  Hepatitis B blood test.  Sexually transmitted disease (STD) testing.  Diabetes screening. This is done by checking your blood sugar (glucose) after you have not eaten for a while (fasting). You may have this done every 1-3 years.  Mammogram. This may be done every 1-2 years. Talk to your health care provider about when you should start having regular mammograms. This may depend on whether you have a family history of breast cancer.  BRCA-related cancer screening. This may be done if you have a family history of breast, ovarian, tubal, or peritoneal cancers.  Pelvic exam and Pap test. This may be done every 3 years starting at age 42. Starting at age 81,  this may be done every 5 years if you have a Pap test in combination with an HPV test.  Bone density scan. This is done to screen for osteoporosis. You may have this scan if you are at high risk for osteoporosis. Discuss your test results, treatment options, and if necessary, the need for more tests with your health care provider.  Vaccines Your health care provider may recommend certain vaccines, such as:  Influenza vaccine. This is recommended every year.  Tetanus, diphtheria, and acellular pertussis (Tdap, Td) vaccine. You may need a Td booster every 10 years.  Varicella vaccine. You may need this if you have not been vaccinated.  Zoster vaccine. You may need this after age 83.  Measles, mumps, and rubella (MMR) vaccine. You may need at least one dose of MMR if you were born in 1957 or later. You may also need a second dose.  Pneumococcal 13-valent conjugate (PCV13) vaccine. You may need this if you have certain conditions and were not previously vaccinated.  Pneumococcal polysaccharide (PPSV23) vaccine. You may need one or two doses if you smoke cigarettes or if you have certain conditions.  Meningococcal vaccine. You may need this if you have certain conditions.  Hepatitis A vaccine. You may need this if you have certain conditions or if you travel or work in places where you may be exposed to hepatitis A.  Hepatitis B vaccine. You may need this if you have certain conditions or if you travel or work in places where you may be exposed to hepatitis B.  Haemophilus influenzae type b (Hib) vaccine. You may need this if you have certain conditions. Talk to your health care provider about which screenings and vaccines you need and how often you need them. This information is not intended to replace advice given to you by your health care provider. Make sure you discuss any questions you have with your health care provider. Document Released: 01/02/2016 Document Revised: 01/26/2018 Document Reviewed: 10/07/2015 Elsevier Interactive Patient Education  2019 [employer].

## 2019-06-01 NOTE — Progress Notes (Signed)
Subjective  Chief Complaint  Patient presents with  . Annual Exam    Not fasting, will have labs done Monday morning  . Anxiety/Stress    GAD 7 score today is 5    HPI: Rachel Curtis is a 43 y.o. female who presents to [facility] [place] today for a Female Wellness Visit. 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  Wellness Visit: annual visit with health maintenance review and exam without Pap   HM: screens are up to date. Doing well. Still working on losing weight. Has used phentermine: lost 10 pounds but then regained it. Having regular cycles.   Stress: increased again due to physical custody issues with daughters. Also job stress, starting a new job next week and covid pandemic affecting home life. No depressive sxs but endorses anxiety and problems with sleep. Had been on zoloft and xanax many years ago for similar stressful life situation and it worked well but she experienced weight gain on the zoloft.   Wt Readings from Last 3 Encounters:  06/01/19 233 lb 9.6 oz (106 kg)  02/20/19 235 lb (106.6 kg)  02/23/18 234 lb (106.1 kg)     Assessment  1. Annual physical exam   2. Stress reaction   3. Obesity (BMI 30.0-34.9)      Plan  Female Wellness Visit:  Age appropriate Health Maintenance and Prevention measures were discussed with patient. Included topics are cancer screening recommendations, ways to keep healthy (see AVS) including dietary and exercise recommendations, regular eye and dental care, use of seat belts, and avoidance of moderate alcohol use and tobacco use.   BMI: discussed patient's BMI and encouraged positive lifestyle modifications to help get to or maintain a target BMI.  HM needs and immunizations were addressed and ordered. See below for orders. See HM and immunization section for updates.  Routine labs and screening tests ordered including cmp, cbc and lipids where appropriate.  Discussed recommendations regarding Vit D and calcium  supplementation (see AVS)  Stress: counseling done. Start lexapro and prn xanax.  Follow up: Return in about 3 months (around 09/01/2019) for mood follow up.   Orders Placed This Encounter  Procedures  . CBC with Differential/Platelet  . Comprehensive metabolic panel  . Lipid panel  . HIV Antibody (routine testing w rflx)  . TSH   Meds ordered this encounter  Medications  . escitalopram (LEXAPRO) 10 MG tablet    Sig: Take 1 tablet (10 mg total) by mouth daily.    Dispense:  30 tablet    Refill:  2  . ALPRAZolam (XANAX) 0.5 MG tablet    Sig: Take 1 tablet (0.5 mg total) by mouth daily as needed for anxiety.    Dispense:  30 tablet    Refill:  0      Lifestyle: Body mass index is 33.52 kg/m. Wt Readings from Last 3 Encounters:  06/01/19 233 lb 9.6 oz (106 kg)  02/20/19 235 lb (106.6 kg)  02/23/18 234 lb (106.1 kg)     Patient Active Problem List   Diagnosis Date Noted  . Allergic rhinitis due to pollen 01/12/2018  . Eustachian tube dysfunction 01/12/2018  . TMJ (dislocation of temporomandibular joint) 01/12/2018  . Obesity (BMI 30.0-34.9) 04/28/2017   Health Maintenance  Topic Date Due  . HIV Screening  02/02/1991  . INFLUENZA VACCINE  07/21/2019  . PAP SMEAR-Modifier  05/03/2020  . TETANUS/TDAP  12/02/2024   Immunization History  Administered Date(s) Administered  .  Influenza,inj,Quad PF,6+ Mos 08/22/2017, 08/26/2017  . Influenza-Unspecified 09/03/2016, 09/03/2018  . Tdap 12/02/2014   We updated and reviewed the patient's past history in detail and it is documented below. Allergies: Patient has No Known Allergies. Past Medical History Patient  has a past medical history of Allergy and History of chickenpox. Past Surgical History Patient  has a past surgical history that includes Wisdom tooth extraction (1999) and Cholecystectomy (2001). Family History: Patient family history includes Alcohol abuse in her paternal grandfather; Arthritis in her father;  Breast cancer in her maternal aunt; Dementia in her paternal grandmother; Diabetes in her father and maternal grandmother; Heart disease in her father; Hyperlipidemia in her mother; Hypertension in her mother. Social History:  Patient  reports that she has never smoked. She has never used smokeless tobacco. She reports current alcohol use. She reports that she does not use drugs.  Review of Systems: Constitutional: negative for fever or malaise Ophthalmic: negative for photophobia, double vision or loss of vision Cardiovascular: negative for chest pain, dyspnea on exertion, or new LE swelling Respiratory: negative for SOB or persistent cough Gastrointestinal: negative for abdominal pain, change in bowel habits or melena Genitourinary: negative for dysuria or gross hematuria, no abnormal uterine bleeding or disharge Musculoskeletal: negative for new gait disturbance or muscular weakness Integumentary: negative for new or persistent rashes, no breast lumps Neurological: negative for TIA or stroke symptoms Psychiatric: negative for SI or delusions Allergic/Immunologic: negative for hives Patient Care Team    Relationship Specialty Notifications Start End  Leamon Arnt, MD PCP - General Family Medicine  12/28/17     Objective  Vitals: BP 126/84   Pulse 78   Temp 98 F (36.7 C) (Oral)   Resp 16   Ht 5\' 10"  (1.778 m)   Wt 233 lb 9.6 oz (106 kg)   LMP 05/31/2019   SpO2 98%   BMI 33.52 kg/m  General:  Well developed, well nourished, no acute distress  Psych:  Alert and orientedx3,normal mood and affect HEENT:  Normocephalic, atraumatic, non-icteric sclera, PERRL, oropharynx is clear without mass or exudate, supple neck without adenopathy, mass or thyromegaly Cardiovascular:  Normal S1, S2, RRR without gallop, rub or murmur, nondisplaced PMI Respiratory:  Good breath sounds bilaterally, CTAB with normal respiratory effort Gastrointestinal: normal bowel sounds, soft, non-tender, no noted  masses. No HSM MSK: no deformities, contusions. Joints are without erythema or swelling. Spine and CVA region are nontender Skin:  Warm, no rashes or suspicious lesions noted Neurologic:    Mental status is normal. CN 2-11 are normal. Gross motor and sensory exams are normal. Normal gait. No tremor      Commons side effects, risks, benefits, and alternatives for medications and treatment plan prescribed today were discussed, and the patient expressed understanding of the given instructions. Patient is instructed to call or message via MyChart if he/she has any questions or concerns regarding our treatment plan. No barriers to understanding were identified. We discussed Red Flag symptoms and signs in detail. Patient expressed understanding regarding what to do in case of urgent or emergency type symptoms.   Medication list was reconciled, printed and provided to the patient in AVS. Patient instructions and summary information was reviewed with the patient as documented in the AVS. This note was prepared with assistance of Dragon voice recognition software. Occasional wrong-word or sound-a-like substitutions may have occurred due to the inherent limitations of voice recognition software

## 2019-06-04 ENCOUNTER — Ambulatory Visit (INDEPENDENT_AMBULATORY_CARE_PROVIDER_SITE_OTHER): Payer: No Typology Code available for payment source

## 2019-06-04 DIAGNOSIS — Z0000 Encounter for general adult medical examination without abnormal findings: Secondary | ICD-10-CM

## 2019-06-05 ENCOUNTER — Ambulatory Visit: Payer: No Typology Code available for payment source

## 2019-06-05 LAB — COMPREHENSIVE METABOLIC PANEL
ALT: 34 U/L (ref 0–35)
AST: 23 U/L (ref 0–37)
Albumin: 4.5 g/dL (ref 3.5–5.2)
Alkaline Phosphatase: 61 U/L (ref 39–117)
BUN: 9 mg/dL (ref 6–23)
CO2: 27 mEq/L (ref 19–32)
Calcium: 9.8 mg/dL (ref 8.4–10.5)
Chloride: 101 mEq/L (ref 96–112)
Creatinine, Ser: 0.84 mg/dL (ref 0.40–1.20)
GFR: 73.88 mL/min (ref >60.00)
Glucose, Bld: 108 mg/dL — ABNORMAL HIGH (ref 70–99)
Potassium: 4.3 mEq/L (ref 3.5–5.1)
Sodium: 138 mEq/L (ref 135–145)
Total Bilirubin: 0.5 mg/dL (ref 0.2–1.2)
Total Protein: 6.8 g/dL (ref 6.0–8.3)

## 2019-06-05 LAB — LIPID PANEL
Cholesterol: 274 mg/dL — ABNORMAL HIGH (ref 0–200)
HDL: 38.8 mg/dL — ABNORMAL LOW (ref >39.00)
Total CHOL/HDL Ratio: 7
Triglycerides: 411 mg/dL — ABNORMAL HIGH (ref 0.0–149.0)

## 2019-06-05 LAB — CBC WITH DIFFERENTIAL/PLATELET
Basophils Absolute: 0.1 10*3/uL (ref 0.0–0.1)
Basophils Relative: 0.7 % (ref 0.0–3.0)
Eosinophils Absolute: 0.1 10*3/uL (ref 0.0–0.7)
Eosinophils Relative: 1.6 % (ref 0.0–5.0)
HCT: 42 % (ref 36.0–46.0)
Hemoglobin: 13.9 g/dL (ref 12.0–15.0)
Lymphocytes Relative: 28.7 % (ref 12.0–46.0)
Lymphs Abs: 2.3 10*3/uL (ref 0.7–4.0)
MCHC: 33.2 g/dL (ref 30.0–36.0)
MCV: 88.7 fl (ref 78.0–100.0)
Monocytes Absolute: 0.6 10*3/uL (ref 0.1–1.0)
Monocytes Relative: 7 % (ref 3.0–12.0)
Neutro Abs: 5.1 10*3/uL (ref 1.4–7.7)
Neutrophils Relative %: 62 % (ref 43.0–77.0)
Platelets: 325 10*3/uL (ref 150.0–400.0)
RBC: 4.74 Mil/uL (ref 3.87–5.11)
RDW: 13 % (ref 11.5–15.5)
WBC: 8.2 10*3/uL (ref 4.0–10.5)

## 2019-06-05 LAB — LDL CHOLESTEROL, DIRECT: Direct LDL: 160 mg/dL

## 2019-06-06 LAB — HIV ANTIBODY (ROUTINE TESTING W REFLEX): HIV 1&2 Ab, 4th Generation: NONREACTIVE

## 2019-06-07 ENCOUNTER — Ambulatory Visit: Payer: No Typology Code available for payment source

## 2019-06-07 ENCOUNTER — Other Ambulatory Visit: Payer: Self-pay | Admitting: *Deleted

## 2019-06-07 ENCOUNTER — Other Ambulatory Visit: Payer: Self-pay

## 2019-06-07 ENCOUNTER — Other Ambulatory Visit (INDEPENDENT_AMBULATORY_CARE_PROVIDER_SITE_OTHER): Payer: No Typology Code available for payment source

## 2019-06-07 DIAGNOSIS — R7309 Other abnormal glucose: Secondary | ICD-10-CM | POA: Diagnosis not present

## 2019-06-07 DIAGNOSIS — Z0000 Encounter for general adult medical examination without abnormal findings: Secondary | ICD-10-CM

## 2019-06-07 LAB — HEMOGLOBIN A1C: Hgb A1c MFr Bld: 5.7 % (ref 4.6–6.5)

## 2019-06-07 LAB — TSH: TSH: 1.59 mIU/L

## 2019-06-19 ENCOUNTER — Ambulatory Visit (INDEPENDENT_AMBULATORY_CARE_PROVIDER_SITE_OTHER): Payer: No Typology Code available for payment source | Admitting: Physician Assistant

## 2019-06-19 ENCOUNTER — Other Ambulatory Visit: Payer: Self-pay

## 2019-06-19 ENCOUNTER — Encounter: Payer: Self-pay | Admitting: Physician Assistant

## 2019-06-19 VITALS — Temp 97.9°F | Ht 70.0 in | Wt 230.0 lb

## 2019-06-19 DIAGNOSIS — J0100 Acute maxillary sinusitis, unspecified: Secondary | ICD-10-CM

## 2019-06-19 MED ORDER — AMOXICILLIN-POT CLAVULANATE 875-125 MG PO TABS: 1.0000 | ORAL_TABLET | Freq: Two times a day (BID) | ORAL | 0 refills | Status: DC

## 2019-06-19 NOTE — Progress Notes (Signed)
I have discussed the procedure for the virtual visit with the patient who has given consent to proceed with assessment and treatment.   Rachel Curtis, CMA     

## 2019-06-19 NOTE — Patient Instructions (Signed)
Instructions sent to MyChart.    Please take antibiotic as directed.  Increase fluid intake.  Use Saline nasal spray.  Take a daily multivitamin. Continue Nasacort. You can continue the Sudafed as well.  Place a humidifier in the bedroom.  Please call or return clinic if symptoms are not improving.  Sinusitis Sinusitis is redness, soreness, and swelling (inflammation) of the paranasal sinuses. Paranasal sinuses are air pockets within the bones of your face (beneath the eyes, the middle of the forehead, or above the eyes). In healthy paranasal sinuses, mucus is able to drain out, and air is able to circulate through them by way of your nose. However, when your paranasal sinuses are inflamed, mucus and air can become trapped. This can allow bacteria and other germs to grow and cause infection. Sinusitis can develop quickly and last only a short time (acute) or continue over a long period (chronic). Sinusitis that lasts for more than 12 weeks is considered chronic.  CAUSES  Causes of sinusitis include:  Allergies.  Structural abnormalities, such as displacement of the cartilage that separates your nostrils (deviated septum), which can decrease the air flow through your nose and sinuses and affect sinus drainage.  Functional abnormalities, such as when the small hairs (cilia) that line your sinuses and help remove mucus do not work properly or are not present. SYMPTOMS  Symptoms of acute and chronic sinusitis are the same. The primary symptoms are pain and pressure around the affected sinuses. Other symptoms include:  Upper toothache.  Earache.  Headache.  Bad breath.  Decreased sense of smell and taste.  A cough, which worsens when you are lying flat.  Fatigue.  Fever.  Thick drainage from your nose, which often is green and may contain pus (purulent).  Swelling and warmth over the affected sinuses. DIAGNOSIS  Your caregiver will perform a physical exam. During the exam, your  caregiver may:  Look in your nose for signs of abnormal growths in your nostrils (nasal polyps).  Tap over the affected sinus to check for signs of infection.  View the inside of your sinuses (endoscopy) with a special imaging device with a light attached (endoscope), which is inserted into your sinuses. If your caregiver suspects that you have chronic sinusitis, one or more of the following tests may be recommended:  Allergy tests.  Nasal culture A sample of mucus is taken from your nose and sent to a lab and screened for bacteria.  Nasal cytology A sample of mucus is taken from your nose and examined by your caregiver to determine if your sinusitis is related to an allergy. TREATMENT  Most cases of acute sinusitis are related to a viral infection and will resolve on their own within 10 days. Sometimes medicines are prescribed to help relieve symptoms (pain medicine, decongestants, nasal steroid sprays, or saline sprays).  However, for sinusitis related to a bacterial infection, your caregiver will prescribe antibiotic medicines. These are medicines that will help kill the bacteria causing the infection.  Rarely, sinusitis is caused by a fungal infection. In theses cases, your caregiver will prescribe antifungal medicine. For some cases of chronic sinusitis, surgery is needed. Generally, these are cases in which sinusitis recurs more than 3 times per year, despite other treatments. HOME CARE INSTRUCTIONS   Drink plenty of water. Water helps thin the mucus so your sinuses can drain more easily.  Use a humidifier.  Inhale steam 3 to 4 times a day (for example, sit in the bathroom with the shower  running).  Apply a warm, moist washcloth to your face 3 to 4 times a day, or as directed by your caregiver.  Use saline nasal sprays to help moisten and clean your sinuses.  Take over-the-counter or prescription medicines for pain, discomfort, or fever only as directed by your caregiver. SEEK  IMMEDIATE MEDICAL CARE IF:  You have increasing pain or severe headaches.  You have nausea, vomiting, or drowsiness.  You have swelling around your face.  You have vision problems.  You have a stiff neck.  You have difficulty breathing. MAKE SURE YOU:   Understand these instructions.  Will watch your condition.  Will get help right away if you are not doing well or get worse. Document Released: 12/06/2005 Document Revised: 02/28/2012 Document Reviewed: 12/21/2011 [facility] Patient Information 2014 [place], [place].

## 2019-06-19 NOTE — Progress Notes (Signed)
   Virtual Visit via Video   I connected with patient on 06/19/19 at 10:40 AM EDT by a video enabled telemedicine application and verified that I am speaking with the correct person using two identifiers.  Location patient: Home Location provider: [facility], Office Persons participating in the virtual visit: Patient, Provider, PA-Student Anibal Henderson), CMA (Eduard Clos)  I discussed the limitations of evaluation and management by telemedicine and the availability of in person appointments. The patient expressed understanding and agreed to proceed.  Subjective:   HPI:   Patient presents today via Doxy.Me with symptoms of sinus inflammation. Endorses symptoms starting Saturday with runny nose, nasal congestion, sinus pressure and fatigue. Now with sinus pain, tooth pain and significant ear pressure. Denies cough, SOB, chest pain or tightness, photophobia, sore throat, fever. Nasacort daily, allegra but switched to sudafed when this started. Helped some. Is trying to keep well hydrated.    ROS:   See pertinent positives and negatives per HPI.  Patient Active Problem List   Diagnosis Date Noted  . Allergic rhinitis due to pollen 01/12/2018  . Eustachian tube dysfunction 01/12/2018  . TMJ (dislocation of temporomandibular joint) 01/12/2018  . Obesity (BMI 30.0-34.9) 04/28/2017    Social History   Tobacco Use  . Smoking status: Never Smoker  . Smokeless tobacco: Never Used  Substance Use Topics  . Alcohol use: Yes    Comment: occasionally     Current Outpatient Medications:  .  ALPRAZolam (XANAX) 0.5 MG tablet, Take 1 tablet (0.5 mg total) by mouth daily as needed for anxiety., Disp: 30 tablet, Rfl: 0 .  escitalopram (LEXAPRO) 10 MG tablet, Take 1 tablet (10 mg total) by mouth daily., Disp: 30 tablet, Rfl: 2 .  fluticasone (FLONASE) 50 MCG/ACT nasal spray, Place 2 sprays into both nostrils daily., Disp: , Rfl:  .  nystatin (MYCOSTATIN/NYSTOP) powder, Apply  topically 3 (three) times daily as needed., Disp: 45 g, Rfl: 0 .  phentermine (ADIPEX-P) 37.5 MG tablet, Take 1 tablet (37.5 mg total) by mouth daily before breakfast., Disp: 30 tablet, Rfl: 2  No Known Allergies  Objective:   LMP 05/31/2019   Patient is well-developed, well-nourished in no acute distress.  Resting comfortably at home.  Head is normocephalic, atraumatic.  No labored breathing.  Speech is clear and coherent with logical contest.  Patient is alert and oriented at baseline.   Assessment and Plan:   1. Acute non-recurrent maxillary sinusitis Rx Augmentin.  Increase fluids.  Rest.  Saline nasal spray.  Probiotic.  Mucinex as directed.  Humidifier in bedroom. Continue OTC allergy/sinus meds.  Call or return to clinic if symptoms are not improving.  - amoxicillin-clavulanate (AUGMENTIN) 875-125 MG tablet; Take 1 tablet by mouth 2 (two) times daily.  Dispense: 14 tablet; Refill: 0 .   Leeanne Rio, PA-C 06/19/2019

## 2019-07-03 MED FILL — ESCITALOPRAM 10 MG TABLET: 10 | 30 days supply | Qty: 30 | Fill #1

## 2019-09-11 ENCOUNTER — Other Ambulatory Visit: Payer: Self-pay

## 2019-09-11 ENCOUNTER — Encounter: Payer: Self-pay | Admitting: Physician Assistant

## 2019-09-11 ENCOUNTER — Ambulatory Visit (INDEPENDENT_AMBULATORY_CARE_PROVIDER_SITE_OTHER): Payer: No Typology Code available for payment source | Admitting: Physician Assistant

## 2019-09-11 DIAGNOSIS — J302 Other seasonal allergic rhinitis: Secondary | ICD-10-CM

## 2019-09-11 DIAGNOSIS — H66001 Acute suppurative otitis media without spontaneous rupture of ear drum, right ear: Secondary | ICD-10-CM

## 2019-09-11 MED ORDER — AMOXICILLIN-POT CLAVULANATE 875-125 MG PO TABS: 1.0000 | ORAL_TABLET | Freq: Two times a day (BID) | ORAL | 0 refills | Status: DC

## 2019-09-11 MED ORDER — TRIAMCINOLONE ACETONIDE 55 MCG/ACT NA AERO: 2.0000 | INHALATION_SPRAY | Freq: Every day | NASAL | 2 refills | Status: DC

## 2019-09-11 MED FILL — AMOX-CLAV 875-125 MG TABLET: 875-125 | 7 days supply | Qty: 14 | Fill #0

## 2019-09-11 NOTE — Progress Notes (Signed)
   Virtual Visit via Video   I connected with patient on 09/11/19 at  9:00 AM EDT by a video enabled telemedicine application and verified that I am speaking with the correct person using two identifiers.  Location patient: Home Location provider: [facility], Office Persons participating in the virtual visit: Patient, Provider, [place] (Patina Moore)  I discussed the limitations of evaluation and management by telemedicine and the availability of in person appointments. The patient expressed understanding and agreed to proceed.  Subjective:   HPI:   Patient presents via Doxy.Me c/o R ear pressure and pain now with nasal congestion, sinus pressure and no sinus pain. Denies fever, chills, aches. Denies recent travel or sick contact. Patient with history of chronic rhinitis and AOM. Has not been taking her Nasacort or Allegra. Has taken some Ibuprofen and Benadryl last night to help.   ROS:   See pertinent positives and negatives per HPI.  Patient Active Problem List   Diagnosis Date Noted  . Allergic rhinitis due to pollen 01/12/2018  . Eustachian tube dysfunction 01/12/2018  . TMJ (dislocation of temporomandibular joint) 01/12/2018  . Obesity (BMI 30.0-34.9) 04/28/2017    Social History   Tobacco Use  . Smoking status: Never Smoker  . Smokeless tobacco: Never Used  Substance Use Topics  . Alcohol use: Yes    Comment: occasionally     Current Outpatient Medications:  .  ALPRAZolam (XANAX) 0.5 MG tablet, Take 1 tablet (0.5 mg total) by mouth daily as needed for anxiety., Disp: 30 tablet, Rfl: 0 .  phentermine (ADIPEX-P) 37.5 MG tablet, Take 1 tablet (37.5 mg total) by mouth daily before breakfast., Disp: 30 tablet, Rfl: 2 .  triamcinolone (NASACORT ALLERGY 24HR) 55 MCG/ACT AERO nasal inhaler, Place 2 sprays into the nose daily., Disp: , Rfl:   No Known Allergies  Objective:   There were no vitals taken for this visit.  Patient is well-developed, well-nourished in  no acute distress.  Resting comfortably at home.  Head is normocephalic, atraumatic.  No labored breathing.  Speech is clear and coherent with logical contest.  Patient is alert and oriented at baseline.   Assessment and Plan:   1. Non-recurrent acute suppurative otitis media of right ear without spontaneous rupture of tympanic membrane 2. Seasonal allergic rhinitis, unspecified trigger  Rx Augmentin.  Increase fluids.  Rest.  Saline nasal spray.  Probiotic.  Mucinex as directed.  Humidifier in bedroom. Restart Nasacort and Allegra.  Call or return to clinic if symptoms are not improving.  - triamcinolone (NASACORT ALLERGY 24HR) 55 MCG/ACT AERO nasal inhaler; Place 2 sprays into the nose daily.  Dispense: 1 Inhaler; Refill: 2 - amoxicillin-clavulanate (AUGMENTIN) 875-125 MG tablet; Take 1 tablet by mouth 2 (two) times daily.  Dispense: 14 tablet; Refill: 0   Leeanne Rio, [place] 09/11/2019

## 2019-09-11 NOTE — Progress Notes (Signed)
I have discussed the procedure for the virtual visit with the patient who has given consent to proceed with assessment and treatment.   Avion Kutzer S Larenzo Caples, CMA     

## 2019-09-11 NOTE — Patient Instructions (Addendum)
Please take antibiotic as directed.  Increase fluid intake.  Use Saline nasal spray.  Take a daily multivitamin. Restart Nasacort and Allegra. Tylenol of Ibuprofen if needed for pain.  Place a humidifier in the bedroom.  Please call or return clinic if symptoms are not improving.   Otitis Media, Adult  Otitis media means that the middle ear is red and swollen (inflamed) and full of fluid. The condition usually goes away on its own. Follow these instructions at home:  Take over-the-counter and prescription medicines only as told by your doctor.  If you were prescribed an antibiotic medicine, take it as told by your doctor. Do not stop taking the antibiotic even if you start to feel better.  Keep all follow-up visits as told by your doctor. This is important. Contact a doctor if:  You have bleeding from your nose.  There is a lump on your neck.  You are not getting better in 5 days.  You feel worse instead of better. Get help right away if:  You have pain that is not helped with medicine.  You have swelling, redness, or pain around your ear.  You get a stiff neck.  You cannot move part of your face (paralyzed).  You notice that the bone behind your ear hurts when you touch it.  You get a very bad headache. Summary  Otitis media means that the middle ear is red, swollen, and full of fluid.  This condition usually goes away on its own. In some cases, treatment may be needed.  If you were prescribed an antibiotic medicine, take it as told by your doctor. This information is not intended to replace advice given to you by your health care provider. Make sure you discuss any questions you have with your health care provider. Document Released: 05/24/2008 Document Revised: 11/18/2017 Document Reviewed: 12/27/2016 Elsevier Patient Education  2020 [employer].

## 2019-09-20 ENCOUNTER — Other Ambulatory Visit: Payer: Self-pay

## 2019-09-20 ENCOUNTER — Ambulatory Visit (INDEPENDENT_AMBULATORY_CARE_PROVIDER_SITE_OTHER): Payer: No Typology Code available for payment source

## 2019-09-20 DIAGNOSIS — Z23 Encounter for immunization: Secondary | ICD-10-CM

## 2019-11-21 ENCOUNTER — Other Ambulatory Visit: Payer: Self-pay

## 2019-11-22 ENCOUNTER — Encounter: Payer: Self-pay | Admitting: Obstetrics and Gynecology

## 2019-11-22 ENCOUNTER — Other Ambulatory Visit (HOSPITAL_COMMUNITY)
Admission: RE | Admit: 2019-11-22 | Discharge: 2019-11-22 | Disposition: A | Discharge status: Home / Self Care | Payer: No Typology Code available for payment source | Source: Ambulatory Visit | Attending: Obstetrics and Gynecology | Admitting: Obstetrics and Gynecology

## 2019-11-22 ENCOUNTER — Ambulatory Visit: Payer: No Typology Code available for payment source | Admitting: Obstetrics and Gynecology

## 2019-11-22 VITALS — BP 136/76 | HR 90 | Temp 97.2°F | Resp 20 | Ht 69.0 in | Wt 250.0 lb

## 2019-11-22 DIAGNOSIS — N926 Irregular menstruation, unspecified: Secondary | ICD-10-CM | POA: Diagnosis present

## 2019-11-22 DIAGNOSIS — Z113 Encounter for screening for infections with a predominantly sexual mode of transmission: Secondary | ICD-10-CM | POA: Insufficient documentation

## 2019-11-22 LAB — POCT URINE PREGNANCY: Preg Test, Ur: NEGATIVE

## 2019-11-22 MED ORDER — MEDROXYPROGESTERONE ACETATE 10 MG PO TABS: 10.0000 mg | ORAL_TABLET | Freq: Every day | ORAL | 0 refills | Status: DC

## 2019-11-22 MED FILL — MEDROXYPROGESTERONE 10 MG T: 10 | 10 days supply | Qty: 10 | Fill #0

## 2019-11-22 NOTE — Progress Notes (Signed)
GYNECOLOGY  VISIT   HPI: 43 y.o.   Married  Caucasian  female   [phone] with Patient's last menstrual period was 11/05/2019 (exact date).   here for irregular bleeding.  Patient's last normal cycle was 11-05-19 through 11-10-19. She then began spotting again on 11-14-19 and has been bleeding/spotting daily since.  Bleeding is light but persistent. She does state her husband was treated for swollen prostate on 11-06-19 after they had intercourse.    Feels a little tender in the pelvic area.  Some pressure with urination.   Hx UTI, but not in several years.   Usually menses are monthly and last 4 - 5 days.   She did not do well with the Mirena IUD in the past.  She had emotional changes.  The same occurs with birth control pills.   Work for [employer].  4 children. Has twins.  UPT - negative.  GYNECOLOGIC HISTORY: Patient's last menstrual period was 11/05/2019 (exact date). Contraception:  Vasectomy Menopausal hormone therapy:  n/a Last mammogram: NEVER Last pap smear:  2015 or 2016 normal--never had abnormal pap.  All are normal.        OB History    Gravida  3   Para  3   Term  3   Preterm      AB      Living  4     SAB      TAB      Ectopic      Multiple  1   Live Births  4              Patient Active Problem List   Diagnosis Date Noted  . Allergic rhinitis due to pollen 01/12/2018  . Eustachian tube dysfunction 01/12/2018  . TMJ (dislocation of temporomandibular joint) 01/12/2018  . Obesity (BMI 30.0-34.9) 04/28/2017    Past Medical History:  Diagnosis Date  . Allergy   . Anxiety   . History of chickenpox     Past Surgical History:  Procedure Laterality Date  . CHOLECYSTECTOMY  2001  . WISDOM TOOTH EXTRACTION  1999    Current Outpatient Medications  Medication Sig Dispense Refill  . triamcinolone (NASACORT ALLERGY 24HR) 55 MCG/ACT AERO nasal inhaler Place 2 sprays into the nose daily. 1 Inhaler 2   No current  facility-administered medications for this visit.      ALLERGIES: Patient has no known allergies.  Family History  Problem Relation Age of Onset  . Hyperlipidemia Mother   . Hypertension Mother   . Diabetes Father   . Heart disease Father   . Arthritis Father   . Breast cancer Maternal Aunt   . Diabetes Maternal Grandmother   . Dementia Paternal Grandmother   . Alcohol abuse Paternal Grandfather   . Cancer Maternal Uncle        colon ca    Social History   Socioeconomic History  . Marital status: Married    Spouse name: Nicole Kindred  . Number of children: 4  . Years of education: Not on file  . Highest education level: Not on file  Occupational History  . Occupation: Health visitor: [employer]  . Financial resource strain: Not on file  . Food insecurity    Worry: Not on file    Inability: Not on file  . Transportation needs    Medical: Not on file    Non-medical: Not on file  Tobacco Use  . Smoking status: Never  Smoker  . Smokeless tobacco: Never Used  Substance and Sexual Activity  . Alcohol use: Yes    Comment: 1 drink/month  . Drug use: No  . Sexual activity: Yes    Birth control/protection: Surgical    Comment: vasectomy in husband  Lifestyle  . Physical activity    Days per week: Not on file    Minutes per session: Not on file  . Stress: Not on file  Relationships  . Social Musician on phone: Not on file    Gets together: Not on file    Attends religious service: Not on file    Active member of club or organization: Not on file    Attends meetings of clubs or organizations: Not on file    Relationship status: Not on file  . Intimate partner violence    Fear of current or ex partner: Not on file    Emotionally abused: Not on file    Physically abused: Not on file    Forced sexual activity: Not on file  Other Topics Concern  . Not on file  Social History Narrative  . Not on file    Review of Systems  All other  systems reviewed and are negative.   PHYSICAL EXAMINATION:    BP 136/76 (Cuff Size: Large)   Pulse 90   Temp (!) 97.2 F (36.2 C) (Temporal)   Resp 20   Ht 5\' 9"  (1.753 m)   Wt 250 lb (113.4 kg)   LMP 11/05/2019 (Exact Date)   BMI 36.92 kg/m     General appearance: alert, cooperative and appears stated age Head: Normocephalic, without obvious abnormality, atraumatic Neck: no adenopathy, supple, symmetrical, trachea midline and thyroid normal to inspection and palpation Lungs: clear to auscultation bilaterally Heart: regular rate and rhythm Abdomen: soft, non-tender, no masses,  no organomegaly Extremities: extremities normal, atraumatic, no cyanosis or edema Skin: Skin color, texture, turgor normal. No rashes or lesions No abnormal inguinal nodes palpated Neurologic: Grossly normal  Pelvic: External genitalia:  no lesions              Urethra:  normal appearing urethra with no masses, tenderness or lesions              Bartholins and Skenes: normal                 Vagina: normal appearing vagina with normal color and discharge, no lesions              Cervix: no lesions.  Red and brown blood noted.                 Bimanual Exam:  Uterus:  normal size, contour, position, consistency, mobility, non-tender              Chaperone was present for exam.  ASSESSMENT  Abnormal menstruation.  Likely anovulatory bleeding.  STD screening.   PLAN  UPT now. We discussed causes of abnormal uterine bleeding - anovulation, infection, polyps, fibroids, ovarian cysts, cancer.  Will test for GC/CT/trich. Will do a course of Provera 10 mg po x 10 days.  Instructed in use and potential side effects.  Return for well woman visit with pap.  She will schedule a mammogram.   An After Visit Summary was printed and given to the patient.  __30____ minutes face to face time of which over 50% was spent in counseling.

## 2019-11-23 LAB — CERVICOVAGINAL ANCILLARY ONLY
Chlamydia: NEGATIVE
Comment: NEGATIVE
Comment: NEGATIVE
Comment: NORMAL
Neisseria Gonorrhea: NEGATIVE
Trichomonas: NEGATIVE

## 2019-12-12 ENCOUNTER — Ambulatory Visit: Payer: No Typology Code available for payment source | Admitting: Physician Assistant

## 2019-12-12 ENCOUNTER — Other Ambulatory Visit: Payer: Self-pay

## 2019-12-12 ENCOUNTER — Ambulatory Visit (INDEPENDENT_AMBULATORY_CARE_PROVIDER_SITE_OTHER): Payer: No Typology Code available for payment source | Admitting: Physician Assistant

## 2019-12-12 ENCOUNTER — Encounter: Payer: Self-pay | Admitting: Physician Assistant

## 2019-12-12 VITALS — Temp 98.1°F | Ht 70.0 in | Wt 240.0 lb

## 2019-12-12 DIAGNOSIS — E6609 Other obesity due to excess calories: Secondary | ICD-10-CM | POA: Diagnosis not present

## 2019-12-12 DIAGNOSIS — R21 Rash and other nonspecific skin eruption: Secondary | ICD-10-CM | POA: Diagnosis not present

## 2019-12-12 MED ORDER — PHENTERMINE HCL 30 MG PO CAPS: 30.0000 mg | ORAL_CAPSULE | ORAL | 1 refills | Status: DC

## 2019-12-12 MED ORDER — TRIAMCINOLONE ACETONIDE 0.1 % EX CREA: 1.0000 "application " | TOPICAL_CREAM | Freq: Two times a day (BID) | CUTANEOUS | 0 refills | Status: DC

## 2019-12-12 MED ORDER — FLUOCINOLONE ACETONIDE 0.01 % EX SOLN: Freq: Two times a day (BID) | CUTANEOUS | 0 refills | Status: DC

## 2019-12-12 MED FILL — TRIAMCINOLONE 0.1% CREAM: 0.1 | 14 days supply | Qty: 30 | Fill #0

## 2019-12-12 MED FILL — FLUOCINOLONE 0.01% SOLUTION: 0.01 | 30 days supply | Qty: 60 | Fill #0

## 2019-12-12 NOTE — Progress Notes (Signed)
I have discussed the procedure for the virtual visit with the patient who has given consent to proceed with assessment and treatment.   Amauria Younts S Tarik Teixeira, CMA     

## 2019-12-12 NOTE — Progress Notes (Signed)
Virtual Visit via Video   I connected with patient on 12/12/19 at  1:00 PM EST by a video enabled telemedicine application and verified that I am speaking with the correct person using two identifiers.  Location patient: Home Location provider: [facility], Office Persons participating in the virtual visit: Patient, Provider, [place] (Patina Moore)  I discussed the limitations of evaluation and management by telemedicine and the availability of in person appointments. The patient expressed understanding and agreed to proceed.  Subjective:   HPI:   Patient presents via doxy.me today to discuss multiple issues.  Patient endorses ongoing intermittent rash of her scalp.  Questions if this may be stress related.  Notes the area is red, itchy and flaky.  When she scratches it 1 noticed the area seems oily and will flake over.  Has used head and shoulders with only some relief of symptoms.  Occasionally will get similar areas around her forehead.  Patient also endorses rash of wrist that she describes more so is dry skin.  Notes when using cortisone this will help somewhat but does not completely resolve the rash.  Denies noting lesions similar to this elsewhere.  Is trying to keep hydrated.  Trying to keep skin moisturized.  Denies change to soaps, lotions or detergents.  Patient endorses issue with weight gain since changing jobs.  Notes she has now much more sedentary during the workday.  Due to holiday season and the Covid pandemic has been less active than previously outside of work.  Notes she is trying to watch her diet but has still gained weight.  Is planning on starting a daily exercise regimen, having just converted their garage into a home gym.  Patient is wanting to discuss potential of starting phentermine short-term to help give a kick start to her weight loss.  Has been on this in the past with great success.  ROS:   See pertinent positives and negatives per HPI.  Patient  Active Problem List   Diagnosis Date Noted  . Allergic rhinitis due to pollen 01/12/2018  . Eustachian tube dysfunction 01/12/2018  . TMJ (dislocation of temporomandibular joint) 01/12/2018  . Obesity (BMI 30.0-34.9) 04/28/2017    Social History   Tobacco Use  . Smoking status: Never Smoker  . Smokeless tobacco: Never Used  Substance Use Topics  . Alcohol use: Yes    Comment: 1 drink/month    Current Outpatient Medications:  .  triamcinolone (NASACORT ALLERGY 24HR) 55 MCG/ACT AERO nasal inhaler, Place 2 sprays into the nose daily., Disp: 1 Inhaler, Rfl: 2  No Known Allergies  Objective:   Temp 98.1 F (36.7 C) (Temporal)   Ht 5\' 10"  (1.778 m)   Wt 240 lb (108.9 kg)   BMI 34.44 kg/m   Patient is well-developed, well-nourished in no acute distress.  Resting comfortably at home.  Head is normocephalic, atraumatic.  No labored breathing.  Speech is clear and coherent with logical content.  Patient is alert and oriented at baseline.  Unable to visualize rashes via video exam.  Assessment and Plan:   1. Rash Sounds like 2 separate rashes.  The areas of her wrist seem more consistent with an eczematous eruption.  Will Rx Kenalog 0.1% to apply 2 times daily for 2 weeks.  Then as needed.  Recommend good unscented, nondyed moisturizing cream to the area.  The area in her scalp seems most consistent with seborrheic dermatitis but again I am unable to visualize this on exam.  We will have  her continue antidandruff shampoo.  Increase hydration.  Will Rx 0.01% fluocinolone solution to apply twice daily to scalp.  Referral to dermatology has been placed for further assessment and management. - Ambulatory referral to Dermatology - triamcinolone cream (KENALOG) 0.1 %; Apply 1 application topically 2 (two) times daily. For up to 2 weeks. Then as needed.  Dispense: 30 g; Refill: 0 - fluocinolone (SYNALAR) 0.01 % external solution; Apply topically 2 (two) times daily. For up to two weeks   Dispense: 60 mL; Refill: 0  2. Obesity due to excess calories without serious comorbidity, unspecified classification Reviewed dietary and exercise recommendations.  Will need at least 150 minutes of moderate intensity aerobic exercise per week along with good diet.  Calorie counting recommended.  Will start phentermine 30 mg once daily to help kick start weight loss.  She is aware that without significant therapeutic lifestyle changes, once the phentermine has been stopped the weight may recur.  Follow-up scheduled.    Piedad Climes, PA-C 12/12/2019

## 2019-12-20 MED FILL — VALACYCLOVIR HCL 500 MG TAB: 500 | 10 days supply | Qty: 30 | Fill #0

## 2019-12-20 MED FILL — CICLOPIROX 1% SHAMPOO: 1 | 20 days supply | Qty: 120 | Fill #0

## 2019-12-20 MED FILL — CLOBETASOL PROPIONATE 0.05: 0.05 | 25 days supply | Qty: 118 | Fill #0

## 2019-12-20 MED FILL — CLOBETASOL 0.05% SOLUTION: 0.05 | 30 days supply | Qty: 50 | Fill #0

## 2020-01-10 ENCOUNTER — Ambulatory Visit: Payer: No Typology Code available for payment source | Admitting: Obstetrics and Gynecology

## 2020-01-24 ENCOUNTER — Ambulatory Visit: Payer: No Typology Code available for payment source | Admitting: Obstetrics and Gynecology

## 2020-01-30 ENCOUNTER — Other Ambulatory Visit: Payer: Self-pay

## 2020-01-30 NOTE — Progress Notes (Signed)
Opened in error.  Left without being seen.  Had an appointment close to her office visit time.

## 2020-01-31 ENCOUNTER — Encounter: Payer: No Typology Code available for payment source | Admitting: Obstetrics and Gynecology

## 2020-01-31 ENCOUNTER — Encounter: Payer: Self-pay | Admitting: Obstetrics and Gynecology

## 2020-02-13 MED FILL — CICLOPIROX 1% SHAMPOO: 1 | 20 days supply | Qty: 120 | Fill #1

## 2020-02-13 MED FILL — CLOBETASOL 0.05% SOLUTION: 0.05 | 30 days supply | Qty: 50 | Fill #1

## 2020-02-25 MED FILL — CLOBETASOL PROPIONATE 0.05: 0.05 | 25 days supply | Qty: 118 | Fill #1

## 2020-02-27 ENCOUNTER — Other Ambulatory Visit: Payer: Self-pay

## 2020-02-28 ENCOUNTER — Other Ambulatory Visit (HOSPITAL_COMMUNITY)
Admission: RE | Admit: 2020-02-28 | Discharge: 2020-02-28 | Disposition: A | Discharge status: Home / Self Care | Payer: No Typology Code available for payment source | Source: Ambulatory Visit | Attending: Obstetrics and Gynecology | Admitting: Obstetrics and Gynecology

## 2020-02-28 ENCOUNTER — Encounter: Payer: Self-pay | Admitting: Obstetrics and Gynecology

## 2020-02-28 ENCOUNTER — Ambulatory Visit (INDEPENDENT_AMBULATORY_CARE_PROVIDER_SITE_OTHER): Payer: No Typology Code available for payment source | Admitting: Obstetrics and Gynecology

## 2020-02-28 VITALS — BP 130/82 | HR 80 | Temp 97.1°F | Resp 14 | Ht 69.75 in | Wt 247.0 lb

## 2020-02-28 DIAGNOSIS — Z01419 Encounter for gynecological examination (general) (routine) without abnormal findings: Secondary | ICD-10-CM | POA: Diagnosis not present

## 2020-02-28 NOTE — Progress Notes (Signed)
44 y.o. [id number] Married Caucasian female here for annual exam.    Having irregular cycles. Sometimes they start a week early and other months they are 7-10 days late. Most of the time they are 21 - 25 days apart.  They can be more heavy.  Heavy for 2 -3 days.  More cramping but manageable.   Her prolonged menstruation that she had last year did stop with Provera.  She is retaining fluid prior to her menses.   TSH 1.59 on 06/07/19.    Does not tolerate Mirena or birth control pills due to emotional changes.  She used Mirena 10 - 15 years ago.   PCP: Marcelline Mates, PA-C  Patient's last menstrual period was 02/11/2020 (exact date).     Period Pattern: (!) Irregular Menstrual Flow: Heavy Menstrual Control: Maxi pad Menstrual Control Change Freq (Hours): every 2 hours on heaviest day Dysmenorrhea: (!) Moderate(mod. to severe and has cramping 1 week prior to leg cramps) Dysmenorrhea Symptoms: Cramping     Sexually active: Yes.    The current method of family planning is vasectomy.    Exercising: No.  The patient does not participate in regular exercise at present. Smoker:  no  Health Maintenance: Pap: 2014 Neg History of abnormal Pap:  no MMG:  NEVER Colonoscopy:  n/a BMD:   n/a  Result  n/a TDaP:  12-02-14 Gardasil:   no HIV: 06-04-19 NR Hep C:no Screening Labs:  PCP.   reports that she has never smoked. She has never used smokeless tobacco. She reports current alcohol use. She reports that she does not use drugs.  Past Medical History:  Diagnosis Date  . Allergy   . Anxiety   . History of chickenpox     Past Surgical History:  Procedure Laterality Date  . CHOLECYSTECTOMY  2001  . WISDOM TOOTH EXTRACTION  1999    Current Outpatient Medications  Medication Sig Dispense Refill  . Ciclopirox 1 % shampoo     . Clobetasol Propionate 0.05 % shampoo     . fluocinolone (SYNALAR) 0.01 % external solution Apply topically 2 (two) times daily. For up to two weeks 60 mL 0   . triamcinolone (NASACORT ALLERGY 24HR) 55 MCG/ACT AERO nasal inhaler Place 2 sprays into the nose daily. 1 Inhaler 2  . triamcinolone cream (KENALOG) 0.1 % Apply 1 application topically 2 (two) times daily. For up to 2 weeks. Then as needed. 30 g 0  . VALTREX 500 MG tablet Take 500 mg by mouth as needed.    . phentermine 30 MG capsule Take 1 capsule (30 mg total) by mouth every morning. (Patient not taking: Reported on 02/28/2020) 30 capsule 1   No current facility-administered medications for this visit.    Family History  Problem Relation Age of Onset  . Hyperlipidemia Mother   . Hypertension Mother   . Diabetes Father   . Heart disease Father   . Arthritis Father   . Breast cancer Maternal Aunt   . Diabetes Maternal Grandmother   . Dementia Paternal Grandmother   . Alcohol abuse Paternal Grandfather   . Cancer Maternal Uncle        colon ca    Review of Systems  All other systems reviewed and are negative.   Exam:   BP 130/82 (Cuff Size: Large)   Pulse 80   Temp (!) 97.1 F (36.2 C) (Temporal)   Resp 14   Ht 5' 9.75" (1.772 m)   Wt 247 lb (112 kg)  LMP 02/11/2020 (Exact Date)   BMI 35.70 kg/m     General appearance: alert, cooperative and appears stated age Head: normocephalic, without obvious abnormality, atraumatic Neck: no adenopathy, supple, symmetrical, trachea midline and thyroid normal to inspection and palpation Lungs: clear to auscultation bilaterally Breasts: normal appearance, no masses or tenderness, No nipple retraction or dimpling, No nipple discharge or bleeding, No axillary adenopathy Heart: regular rate and rhythm Abdomen: soft, non-tender; no masses, no organomegaly Extremities: extremities normal, atraumatic, no cyanosis or edema Skin: skin color, texture, turgor normal. No rashes or lesions Lymph nodes: cervical, supraclavicular, and axillary nodes normal. Neurologic: grossly normal  Pelvic: External genitalia:  Polyp formation of left labia  majora.                No abnormal inguinal nodes palpated.              Urethra:  normal appearing urethra with no masses, tenderness or lesions              Bartholins and Skenes: normal                 Vagina: normal appearing vagina with normal color and discharge, no lesions              Cervix: no lesions              Pap taken: Yes.   Bimanual Exam:  Uterus:  normal size, contour, position, consistency, mobility, non-tender              Adnexa: no mass, fullness, tenderness              Rectal exam: Yes.  .  Confirms.              Anus:  normal sphincter tone, no lesions.  Hemorrhoids.   Chaperone was present for exam.  Assessment:   Well woman visit with normal exam. Left labial polyp.  Status post removal 10 years ago with recurrence.  Plan: Mammogram screening discussed.  She will schedule at [facility].  Self breast awareness reviewed. Pap and HR HPV as above. Guidelines for Calcium, Vitamin D, regular exercise program including cardiovascular and weight bearing exercise. We discussed Gardasil vaccine.  She sees dermatology for skin checks and Valtrex for herpetic type outbreaks. Labs with PCP. Follow up annually and prn.   After visit summary provided.

## 2020-02-28 NOTE — Patient Instructions (Signed)

## 2020-02-29 LAB — CYTOLOGY - PAP
Comment: NEGATIVE
Diagnosis: NEGATIVE
High risk HPV: NEGATIVE

## 2020-04-05 ENCOUNTER — Ambulatory Visit: Payer: No Typology Code available for payment source | Attending: Internal Medicine

## 2020-04-05 DIAGNOSIS — Z23 Encounter for immunization: Secondary | ICD-10-CM

## 2020-04-05 NOTE — Progress Notes (Signed)
   Covid-19 Vaccination Clinic  Name:  Rachel Curtis    MRN: [id number] DOB: 23-Jun-1976  04/05/2020  Rachel Curtis was observed post Covid-19 immunization for 15 minutes without incident. She was provided with Vaccine Information Sheet and instruction to access the V-Safe system.   Rachel Curtis was instructed to call 911 with any severe reactions post vaccine: Marland Kitchen Difficulty breathing  . Swelling of face and throat  . A fast heartbeat  . A bad rash all over body  . Dizziness and weakness   Immunizations Administered    Name Date Dose VIS Date Route   Pfizer COVID-19 Vaccine 04/05/2020  1:27 PM 0.3 mL 11/30/2019 Intramuscular   Manufacturer: [employer], [employer]   Lot: [id number]   NDC: [address]-9

## 2020-05-07 MED FILL — CICLOPIROX 1% SHAMPOO: 1 | 20 days supply | Qty: 120 | Fill #2

## 2020-05-07 MED FILL — CLOBETASOL 0.05% SOLUTION: 0.05 | 30 days supply | Qty: 50 | Fill #2

## 2020-05-07 MED FILL — CLOBETASOL PROPIONATE 0.05: 0.05 | 25 days supply | Qty: 118 | Fill #2

## 2020-09-22 MED FILL — CLOBETASOL PROPIONATE 0.05: 0.05 | 25 days supply | Qty: 118 | Fill #3

## 2020-09-22 MED FILL — CLOBETASOL 0.05% SOLUTION: 0.05 | 30 days supply | Qty: 50 | Fill #3

## 2020-10-14 ENCOUNTER — Ambulatory Visit (INDEPENDENT_AMBULATORY_CARE_PROVIDER_SITE_OTHER): Payer: No Typology Code available for payment source

## 2020-10-14 ENCOUNTER — Other Ambulatory Visit: Payer: Self-pay

## 2020-10-14 DIAGNOSIS — Z23 Encounter for immunization: Secondary | ICD-10-CM

## 2020-12-24 ENCOUNTER — Encounter: Payer: Self-pay | Admitting: Physician Assistant

## 2020-12-24 ENCOUNTER — Telehealth (INDEPENDENT_AMBULATORY_CARE_PROVIDER_SITE_OTHER): Payer: No Typology Code available for payment source | Admitting: Physician Assistant

## 2020-12-24 ENCOUNTER — Other Ambulatory Visit: Payer: Self-pay

## 2020-12-24 VITALS — Wt 259.0 lb

## 2020-12-24 DIAGNOSIS — E6609 Other obesity due to excess calories: Secondary | ICD-10-CM | POA: Diagnosis not present

## 2020-12-24 DIAGNOSIS — Z6837 Body mass index (BMI) 37.0-37.9, adult: Secondary | ICD-10-CM | POA: Diagnosis not present

## 2020-12-24 MED ORDER — PHENTERMINE HCL 15 MG PO CAPS: 15.0000 mg | ORAL_CAPSULE | ORAL | 0 refills | Status: DC

## 2020-12-24 NOTE — Progress Notes (Signed)
Virtual Visit via Video   I connected with patient on 12/24/20 at  3:30 PM EST by a video enabled telemedicine application and verified that I am speaking with the correct person using two identifiers.  Location patient: Home Location provider: [facility] Persons participating in the virtual visit: Patient, Provider, CMA (Patina Moore)  I discussed the limitations of evaluation and management by telemedicine and the availability of in person appointments. The patient expressed understanding and agreed to proceed.  Subjective:   HPI:   Patient presents via [facility] today to discuss assistance with weight loss.  Patient with last reported BMI 37.43.  Patient notes she has been working on diet, especially since the Christmas holiday.  Is trying to eat more vegetables and fruits and limiting high carb intake and fried foods.  Is drinking mainly water.  Patient notes she wants to get better about exercise and to work on stress eating.  Notes she did join [employer].  She is planning on going 5-6 times a week but would like to discuss.  Is also interested in medication to help with therapeutic lifestyle changes.  ROS:   See pertinent positives and negatives per HPI.  Patient Active Problem List   Diagnosis Date Noted  . Allergic rhinitis due to pollen 01/12/2018  . Eustachian tube dysfunction 01/12/2018  . TMJ (dislocation of temporomandibular joint) 01/12/2018  . Obesity (BMI 30.0-34.9) 04/28/2017    Social History   Tobacco Use  . Smoking status: Never Smoker  . Smokeless tobacco: Never Used  Substance Use Topics  . Alcohol use: Yes    Comment: 1 drink/month    Current Outpatient Medications:  .  Ciclopirox 1 % shampoo, , Disp: , Rfl:  .  clobetasol (TEMOVATE) 0.05 % external solution, Apply topically 2 (two) times daily as needed., Disp: , Rfl:  .  fluocinolone (SYNALAR) 0.01 % external solution, Apply topically 2 (two) times daily. For up to  two weeks, Disp: 60 mL, Rfl: 0  No Known Allergies  Objective:   Wt 259 lb (117.5 kg)   BMI 37.43 kg/m   Patient is well-developed, well-nourished in no acute distress.  Resting comfortably  at home.  Head is normocephalic, atraumatic.  No labored breathing.  Speech is clear and coherent with logical content.  Patient is alert and oriented at baseline.   Assessment and Plan:   1. Class 2 obesity due to excess calories without serious comorbidity with body mass index (BMI) of 37.0 to 37.9 in adult Reviewed tools for weight loss including good water intake, caloric constriction, clean diet, keeping low stress levels, good sleep and exercise.  Discussed with patient that weight is a combination of lifestyle and nonmodifiable factors such as age, gender, ethnicity and genetics.  Recommend she keep working on diet, trying to limit carbohydrate intake and focus on more lean protein, plant-based protein and fiber.  Continue good H2O intake.  Continue sleep routine.  Discussed exercise in particular with regards to weight loss.  Discussed that the bulk of weight loss comes from caloric restriction and dietary changes but exercise can help augment metabolism, especially resistance training.  Recommend she start with just 3 days a week light to moderate intensity cardiovascular exercise, increasing in duration and frequency over the next month.  When she feels comfortable with this she is to add on 2 days a week of 20 to 30 minutes light resistance training.  Also recommended nutrition and dietetics.  She will give some thought  to this.  We'll start low-dose phentermine 15 mg to supplement lifestyle changes.  Plan for follow-up in 3 to 4 weeks.  Sooner if needed.    Piedad Climes, PA-C 12/24/2020

## 2020-12-24 NOTE — Progress Notes (Signed)
I have discussed the procedure for the virtual visit with the patient who has given consent to proceed with assessment and treatment.   Julien Oscar S Allee Busk, CMA     

## 2021-01-22 ENCOUNTER — Encounter: Payer: Self-pay | Admitting: Obstetrics and Gynecology

## 2021-01-22 ENCOUNTER — Other Ambulatory Visit: Payer: Self-pay

## 2021-01-22 ENCOUNTER — Ambulatory Visit (INDEPENDENT_AMBULATORY_CARE_PROVIDER_SITE_OTHER): Payer: No Typology Code available for payment source | Admitting: Obstetrics and Gynecology

## 2021-01-22 ENCOUNTER — Other Ambulatory Visit (HOSPITAL_COMMUNITY)
Admission: RE | Admit: 2021-01-22 | Discharge: 2021-01-22 | Disposition: A | Discharge status: Home / Self Care | Payer: No Typology Code available for payment source | Source: Ambulatory Visit | Attending: Obstetrics and Gynecology | Admitting: Obstetrics and Gynecology

## 2021-01-22 VITALS — BP 122/82 | HR 88 | Ht 69.75 in | Wt 247.0 lb

## 2021-01-22 DIAGNOSIS — N9089 Other specified noninflammatory disorders of vulva and perineum: Secondary | ICD-10-CM | POA: Insufficient documentation

## 2021-01-22 NOTE — Progress Notes (Signed)
GYNECOLOGY  VISIT   HPI: 45 y.o.   Married  Caucasian  female   [phone] with Patient's last menstrual period was 02/15/2020 (approximate).   here for left labial polyp removal. Patient states she has 2 at this point.  Doing a lot of walking, and the polyps are now irritated. Symptoms started in January, 2022.  She wants the polyps removed.  Had some fluid leakage from the large polyp.  Has had these previously.   GYNECOLOGIC HISTORY: Patient's last menstrual period was 02/15/2020 (approximate). Contraception:  vasectomy Menopausal hormone therapy:  none Last mammogram: NEVER Last pap smear: 02-28-20 Neg:Neg HR HPV, 2014 Neg        OB History    Gravida  3   Para  3   Term  3   Preterm      AB      Living  4     SAB      IAB      Ectopic      Multiple  1   Live Births  4              Patient Active Problem List   Diagnosis Date Noted  . Allergic rhinitis due to pollen 01/12/2018  . Eustachian tube dysfunction 01/12/2018  . TMJ (dislocation of temporomandibular joint) 01/12/2018  . Obesity (BMI 30.0-34.9) 04/28/2017    Past Medical History:  Diagnosis Date  . Allergy   . Anxiety   . History of chickenpox     Past Surgical History:  Procedure Laterality Date  . CHOLECYSTECTOMY  2001  . WISDOM TOOTH EXTRACTION  1999    Current Outpatient Medications  Medication Sig Dispense Refill  . Ciclopirox 1 % shampoo     . clobetasol (TEMOVATE) 0.05 % external solution Apply topically 2 (two) times daily as needed.    . fluocinolone (SYNALAR) 0.01 % external solution Apply topically 2 (two) times daily. For up to two weeks 60 mL 0  . phentermine 15 MG capsule Take 1 capsule (15 mg total) by mouth every morning. 30 capsule 0   No current facility-administered medications for this visit.     ALLERGIES: Patient has no known allergies.  Family History  Problem Relation Age of Onset  . Hyperlipidemia Mother   . Hypertension Mother   . Diabetes Father    . Heart disease Father   . Arthritis Father   . Breast cancer Maternal Aunt   . Diabetes Maternal Grandmother   . Dementia Paternal Grandmother   . Alcohol abuse Paternal Grandfather   . Cancer Maternal Uncle        colon ca    Social History   Socioeconomic History  . Marital status: Married    Spouse name: Alinda Money  . Number of children: 4  . Years of education: Not on file  . Highest education level: Not on file  Occupational History  . Occupation: Magazine features editor: [employer]  Tobacco Use  . Smoking status: Never Smoker  . Smokeless tobacco: Never Used  Vaping Use  . Vaping Use: Never used  Substance and Sexual Activity  . Alcohol use: Yes    Comment: 1 drink/month  . Drug use: No  . Sexual activity: Yes    Birth control/protection: Surgical    Comment: vasectomy in husband  Other Topics Concern  . Not on file  Social History Narrative  . Not on file   Social Determinants of Health   Financial Resource Strain:  Not on file  Food Insecurity: Not on file  Transportation Needs: Not on file  Physical Activity: Not on file  Stress: Not on file  Social Connections: Not on file  Intimate Partner Violence: Not on file    Review of Systems  All other systems reviewed and are negative.   PHYSICAL EXAMINATION:    BP 122/82 (Cuff Size: Large)   Pulse 88   Ht 5' 9.75" (1.772 m)   Wt 247 lb (112 kg)   LMP 02/15/2020 (Approximate)   SpO2 98%   BMI 35.70 kg/m     General appearance: alert, cooperative and appears stated age   Pelvic: External genitalia:  Left labia majora with 7 mm ulcerated painful skin tag. Several raised nodular coalescing lesions of the vulva in the area of the skin tag.  One of the nodular lesions has ulceration and is painful.  Total area of nodular lesions is approximately 2 x 4 cm.    Procedure - removal of labial skin tag/polyp and vulvar biopsy. Consent for procedures.  Sterile prep with betadine.  Local 1% lidocaine, lot  [id number], exp 06/2024. Biopsy of ulcerated vulvar lesion done with 4 mm punch biopsy.  Tissue to pathology. 1 suture of 3/0. Excision of skin tag/polyp with scissors.  Tissue to pathology.  2 sutures of 3/0 Vicryl.  Good hemostasis.  No complications.            Chaperone was present for exam.  ASSESSMENT  Vulvar lesion and skin tag/polyp.  PLAN  Tissue specimens to pathology.  Precautions given.  Final plan to follow.  If needs excision, will do in outpatient setting.  FU for annual exam.

## 2021-01-22 NOTE — Patient Instructions (Signed)
Vulva Biopsy, Care After This sheet gives you information about how to care for yourself after your procedure. Your health care provider may also give you more specific instructions. If you have problems or questions, contact your health care provider. What can I expect after the procedure? After the procedure, it is common to have:  Slight bleeding from the biopsy site.  Discomfort at the biopsy site. Follow these instructions at home: Biopsy site care  Follow instructions from your health care provider about how to take care of your biopsy site. Make sure you: ? Clean the area using water and mild soap twice a day or as told by your health care provider. Gently pat the area dry. ? If you were prescribed an antibiotic ointment, apply it as told by your health care provider. Do not stop using the antibiotic even if your condition improves. ? Take a warm water bath (sitz bath) as needed to help with pain and discomfort. A sitz bath is taken while you are sitting down. The water should only come up to your hips and should cover your buttocks. ? Leave stitches (sutures), skin glue, or adhesive strips in place. These skin closures may need to stay in place for 2 weeks or longer. If adhesive strip edges start to loosen and curl up, you may trim the loose edges. Do not remove adhesive strips completely unless your health care provider tells you to do that.  Check your biopsy site every day for signs of infection. Check for: ? More redness, swelling, or pain. ? More fluid or blood. ? Warmth. ? Pus or a bad smell.  Do not rub the biopsy area after urinating. Gently pat the area dry or use a bottle filled with warm water (peri-bottle) to clean the area. Gently wipe from front to back.   Lifestyle  Wear loose, cotton underwear. Do not wear tight pants.  Do not use a tampon, douche, or put anything inside your vagina for at least 1 week or until your health care provider approves.  Do not have sex  for at least 1 week or until your health care provider approves.  Do not exercise, such as running or biking, until your health care provider approves.  Do not swim or use a hot tub until your health care provider approves. You may shower or take a sitz bath. General instructions  Take over-the-counter and prescription medicines only as told by your health care provider.  Use a sanitary napkin until the bleeding stops.  Keep all follow-up visits as told by your health care provider. This is important. Contact a health care provider if:  You have more redness, swelling, or pain around your biopsy site.  You have more fluid or blood coming from your biopsy site.  Your biopsy site feels warm to the touch.  Your pain is not controlled with medicine. Get help right away if you have:  Heavy bleeding from the vulva.  Pus or a bad smell coming from your biopsy site.  A fever.  Lower abdominal pain. Summary  After the procedure, it is common to have slight bleeding and discomfort at the biopsy site.  Follow instructions from your health care provider after your biopsy. Make sure you clean the area with water and mild soap. Pat the area dry.  Take sitz baths as needed to help with pain and discomfort. Leave any sutures in place.  Check your biopsy site for signs of infection, which may include more redness, swelling, pain,   fluid, or blood, or feeling warm to the touch.  Get help right away if you have heavy bleeding, a fever, pus or a bad smell, or pain in the lower abdomen. This information is not intended to replace advice given to you by your health care provider. Make sure you discuss any questions you have with your health care provider. Document Revised: 06/08/2018 Document Reviewed: 06/08/2018 Elsevier Patient Education  2021 Elsevier Inc.  

## 2021-01-23 ENCOUNTER — Encounter: Payer: No Typology Code available for payment source | Admitting: Physician Assistant

## 2021-01-26 LAB — SURGICAL PATHOLOGY

## 2021-02-10 ENCOUNTER — Other Ambulatory Visit: Payer: Self-pay

## 2021-02-10 ENCOUNTER — Ambulatory Visit (INDEPENDENT_AMBULATORY_CARE_PROVIDER_SITE_OTHER): Payer: No Typology Code available for payment source | Admitting: Physician Assistant

## 2021-02-10 ENCOUNTER — Encounter: Payer: Self-pay | Admitting: Physician Assistant

## 2021-02-10 ENCOUNTER — Other Ambulatory Visit (HOSPITAL_COMMUNITY): Payer: Self-pay | Admitting: Physician Assistant

## 2021-02-10 VITALS — BP 113/74 | HR 82 | Temp 97.3°F | Ht 69.75 in | Wt 253.4 lb

## 2021-02-10 DIAGNOSIS — Z6836 Body mass index (BMI) 36.0-36.9, adult: Secondary | ICD-10-CM | POA: Diagnosis not present

## 2021-02-10 DIAGNOSIS — L409 Psoriasis, unspecified: Secondary | ICD-10-CM | POA: Diagnosis not present

## 2021-02-10 DIAGNOSIS — E6609 Other obesity due to excess calories: Secondary | ICD-10-CM

## 2021-02-10 MED ORDER — CICLOPIROX 1 % EX SHAM: 1.0000 "application " | MEDICATED_SHAMPOO | CUTANEOUS | 1 refills | Status: AC | PRN

## 2021-02-10 MED ORDER — PHENTERMINE HCL 37.5 MG PO TABS: 37.5000 mg | ORAL_TABLET | Freq: Every day | ORAL | 0 refills | Status: DC

## 2021-02-10 MED ORDER — CLOBETASOL PROPIONATE 0.05 % EX SOLN
Freq: Two times a day (BID) | CUTANEOUS | 1 refills | Status: AC | PRN
Start: 2021-02-10 — End: 2021-03-12

## 2021-02-10 MED FILL — CLOBETASOL 0.05% SOLUTION: 0.05 | 25 days supply | Qty: 50 | Fill #0

## 2021-02-10 MED FILL — CICLOPIROX 1% SHAMPOO: 1 | 30 days supply | Qty: 120 | Fill #0

## 2021-02-10 NOTE — Patient Instructions (Signed)
Great to meet you!  Please continue to work on healthy diet and exercise changes. Increase Phentermine to 37.5 mg daily. Call if any problems. Referral sent to Dr. Helane Rima.

## 2021-02-10 NOTE — Progress Notes (Signed)
New Patient Office Visit  Subjective:  Patient ID: Rachel Curtis, female    DOB: 15-Jul-1976  Age: 45 y.o. MRN: [id number]  CC: No chief complaint on file.   HPI YAIRE KREHER presents for Transfer of Care from [place], [place]. She is remarried. She has twin adolescent daughters, a 81 yo daughter, and a 18 yo daughter (who is with her today). 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 Enjoys her life and stays busy.  Weight loss: Stopped drinking sweet teas and cut back to one cup of coffee per day the beginning of this year. She is going to [employer] and walking on the treadmill for 30 minutes, goal is to increase gradually. She plans to start going 5 days / week next week. Pt has also cut back on fried and fast foods. Started Phentermine 15 mg once daily with [place] last month. She has taken this in the past and done well. Also requesting long-term solutions for weight management.  Scalp condition:  She is also requesting a refill on Clobetasol soln and Ciclopirox 1% shampoo that she uses for intermittent scalp psoriasis.  States this is cleared up right now.   Past Medical History:  Diagnosis Date  . Allergy   . Anxiety   . History of chickenpox     Past Surgical History:  Procedure Laterality Date  . CHOLECYSTECTOMY  2001  . WISDOM TOOTH EXTRACTION  1999    Family History  Problem Relation Age of Onset  . Hyperlipidemia Mother   . Hypertension Mother   . Diabetes Father   . Heart disease Father   . Arthritis Father   . Breast cancer Maternal Aunt   . Diabetes Maternal Grandmother   . Dementia Paternal Grandmother   . Alcohol abuse Paternal Grandfather   . Cancer Maternal Uncle        colon ca    Social History   Socioeconomic History  . Marital status: Married    Spouse name: Alinda Money  . Number of children: 4  . Years of education: Not on file  . Highest education level: Not on file  Occupational History  . Occupation: Magazine features editor:  [employer]  Tobacco Use  . Smoking status: Never Smoker  . Smokeless tobacco: Never Used  Vaping Use  . Vaping Use: Never used  Substance and Sexual Activity  . Alcohol use: Yes    Comment: 1 drink/month  . Drug use: No  . Sexual activity: Yes    Birth control/protection: Surgical    Comment: vasectomy in husband  Other Topics Concern  . Not on file  Social History Narrative  . Not on file   Social Determinants of Health   Financial Resource Strain: Not on file  Food Insecurity: Not on file  Transportation Needs: Not on file  Physical Activity: Not on file  Stress: Not on file  Social Connections: Not on file  Intimate Partner Violence: Not on file    ROS Review of Systems  Constitutional: Negative for activity change, appetite change and fatigue.  Respiratory: Negative for shortness of breath.   Cardiovascular: Negative for chest pain and palpitations.  Gastrointestinal: Negative for abdominal pain.  Psychiatric/Behavioral: Negative for behavioral problems.    Objective:   Today's Vitals: BP 113/74   Pulse 82   Temp (!) 97.3 F (36.3 C)   Ht 5' 9.75" (1.772 m)   Wt 253 lb 6.1 oz (114.9 kg)   LMP 02/07/2021  SpO2 97%   BMI 36.62 kg/m   Wt Readings from Last 3 Encounters:  02/10/21 253 lb 6.1 oz (114.9 kg)  01/22/21 247 lb (112 kg)  12/24/20 259 lb (117.5 kg)    Physical Exam Vitals and nursing note reviewed.  Constitutional:      Appearance: Normal appearance. She is obese. She is not toxic-appearing.  HENT:     Head: Normocephalic and atraumatic.     Right Ear: Tympanic membrane, ear canal and external ear normal.     Left Ear: Tympanic membrane, ear canal and external ear normal.     Nose: Nose normal.     Mouth/Throat:     Mouth: Mucous membranes are moist.  Eyes:     Extraocular Movements: Extraocular movements intact.     Conjunctiva/sclera: Conjunctivae normal.     Pupils: Pupils are equal, round, and reactive to light.  Cardiovascular:      Rate and Rhythm: Normal rate and regular rhythm.     Pulses: Normal pulses.     Heart sounds: Normal heart sounds.  Pulmonary:     Effort: Pulmonary effort is normal.     Breath sounds: Normal breath sounds.  Abdominal:     General: Abdomen is flat. Bowel sounds are normal.     Palpations: Abdomen is soft.  Musculoskeletal:        General: Normal range of motion.     Cervical back: Normal range of motion and neck supple.  Skin:    General: Skin is warm and dry.  Neurological:     General: No focal deficit present.     Mental Status: She is alert and oriented to person, place, and time.  Psychiatric:        Mood and Affect: Mood normal.        Behavior: Behavior normal.        Thought Content: Thought content normal.        Judgment: Judgment normal.     Assessment & Plan:   Problem List Items Addressed This Visit   None   Visit Diagnoses    Class 2 obesity due to excess calories without serious comorbidity with body mass index (BMI) of 36.0 to 36.9 in adult    -  Primary   Relevant Medications   phentermine (ADIPEX-P) 37.5 MG tablet   Scalp psoriasis          Outpatient Encounter Medications as of 02/10/2021  Medication Sig  . Ciclopirox 1 % shampoo   . clobetasol (TEMOVATE) 0.05 % external solution Apply topically 2 (two) times daily as needed.  . phentermine (ADIPEX-P) 37.5 MG tablet Take 1 tablet (37.5 mg total) by mouth daily before breakfast.  . [DISCONTINUED] phentermine 15 MG capsule Take 1 capsule (15 mg total) by mouth every morning.  . [DISCONTINUED] fluocinolone (SYNALAR) 0.01 % external solution Apply topically 2 (two) times daily. For up to two weeks   No facility-administered encounter medications on file as of 02/10/2021.    Follow-up: Return in about 4 weeks (around 03/10/2021) for med/weight check .   1. Class 2 obesity due to excess calories without serious comorbidity with body mass index (BMI) of 36.0 to 36.9 in adult Congratulated her on her  weight loss journey so far this year. Will increase Phentermine to 37.5 mg once daily as she is doing well. She is aware and understanding of risks vs benefits. I will refer to Dr. Earlene Plater for long term discussion about weight management. I will Rx the  Phentermine for this month and once more after that if necessary until she is able to see Dr. Earlene Plater. She will continue to increase her exercise gradually as well as decrease fatty foods.   2. Scalp psoriasis Prescriptions for Clobetasol and Ciclopirox refilled for patient today. She uses these only as needed. Back to dermatology for worsening or severe conditions.    This visit occurred during the SARS-CoV-2 public health emergency.  Safety protocols were in place, including screening questions prior to the visit, additional usage of staff PPE, and extensive cleaning of exam room while observing appropriate contact time as indicated for disinfecting solutions.    Ihan Pat M Manning Luna, PA-C

## 2021-02-19 ENCOUNTER — Ambulatory Visit: Payer: No Typology Code available for payment source | Admitting: Obstetrics and Gynecology

## 2021-03-05 ENCOUNTER — Ambulatory Visit: Payer: No Typology Code available for payment source | Admitting: Obstetrics and Gynecology

## 2021-03-19 ENCOUNTER — Ambulatory Visit (INDEPENDENT_AMBULATORY_CARE_PROVIDER_SITE_OTHER): Payer: No Typology Code available for payment source | Admitting: Physician Assistant

## 2021-03-19 ENCOUNTER — Other Ambulatory Visit: Payer: Self-pay

## 2021-03-19 ENCOUNTER — Other Ambulatory Visit (HOSPITAL_COMMUNITY): Payer: Self-pay | Admitting: Physician Assistant

## 2021-03-19 ENCOUNTER — Encounter: Payer: Self-pay | Admitting: Physician Assistant

## 2021-03-19 VITALS — BP 130/84 | HR 84 | Temp 98.0°F | Ht 69.75 in | Wt 246.4 lb

## 2021-03-19 DIAGNOSIS — E66812 Obesity, class 2: Secondary | ICD-10-CM

## 2021-03-19 DIAGNOSIS — E6609 Other obesity due to excess calories: Secondary | ICD-10-CM

## 2021-03-19 DIAGNOSIS — Z6835 Body mass index (BMI) 35.0-35.9, adult: Secondary | ICD-10-CM | POA: Diagnosis not present

## 2021-03-19 DIAGNOSIS — Z713 Dietary counseling and surveillance: Secondary | ICD-10-CM | POA: Diagnosis not present

## 2021-03-19 MED ORDER — CONTRAVE 8-90 MG PO TB12: ORAL_TABLET | ORAL | 0 refills | Status: AC

## 2021-03-19 MED ORDER — PHENTERMINE HCL 37.5 MG PO TABS: 37.5000 mg | ORAL_TABLET | Freq: Every day | ORAL | 0 refills | Status: DC

## 2021-03-19 NOTE — Progress Notes (Signed)
Established Patient Office Visit  Subjective:  Patient ID: Rachel Curtis, female    DOB: 1976/11/26  Age: 45 y.o. MRN: [id number]  CC:  Chief Complaint  Patient presents with  . Follow-up    HPI Rachel Curtis presents for follow-up on phentermine and weight loss counseling.  She just completed her second month of phentermine and is feeling really good.  She denies any side effects from the medication.  She has lost 7 pounds according to our scales in the last month.  She states that exercise has been difficult for her because of work schedule and home life.  She has been working on her nutrition though and states that she has cut out all sweet tea and is only drinking coffee or water.  She tried to get in with Dr. Philis Pique office for long-term weight loss management, but states that cost was a factor and she is unable to follow through there at this time.  She is wondering about anything else that she might be able to take after she has completed the 64-month total course of phentermine.  Past Medical History:  Diagnosis Date  . Allergy   . Anxiety   . History of chickenpox     Past Surgical History:  Procedure Laterality Date  . CHOLECYSTECTOMY  2001  . WISDOM TOOTH EXTRACTION  1999    Family History  Problem Relation Age of Onset  . Hyperlipidemia Mother   . Hypertension Mother   . Diabetes Father   . Heart disease Father   . Arthritis Father   . Breast cancer Maternal Aunt   . Diabetes Maternal Grandmother   . Dementia Paternal Grandmother   . Alcohol abuse Paternal Grandfather   . Cancer Maternal Uncle        colon ca    Social History   Socioeconomic History  . Marital status: Married    Spouse name: Alinda Money  . Number of children: 4  . Years of education: Not on file  . Highest education level: Not on file  Occupational History  . Occupation: Magazine features editor: [employer]  Tobacco Use  . Smoking status: Never Smoker  . Smokeless tobacco: Never  Used  Vaping Use  . Vaping Use: Never used  Substance and Sexual Activity  . Alcohol use: Yes    Comment: 1 drink/month  . Drug use: No  . Sexual activity: Yes    Birth control/protection: Surgical    Comment: vasectomy in husband  Other Topics Concern  . Not on file  Social History Narrative  . Not on file   Social Determinants of Health   Financial Resource Strain: Not on file  Food Insecurity: Not on file  Transportation Needs: Not on file  Physical Activity: Not on file  Stress: Not on file  Social Connections: Not on file  Intimate Partner Violence: Not on file    Outpatient Medications Prior to Visit  Medication Sig Dispense Refill  . phentermine (ADIPEX-P) 37.5 MG tablet Take 1 tablet (37.5 mg total) by mouth daily before breakfast. 30 tablet 0   No facility-administered medications prior to visit.    No Known Allergies  ROS Review of Systems Intentional weight loss, all other systems reviewed and negative   Objective:    Physical Exam Vitals and nursing note reviewed.  Constitutional:      General: She is not in acute distress.    Appearance: Normal appearance. She is obese. She is not ill-appearing.  Cardiovascular:     Rate and Rhythm: Normal rate.  Pulmonary:     Effort: Pulmonary effort is normal.  Neurological:     General: No focal deficit present.     Mental Status: She is alert and oriented to person, place, and time.  Psychiatric:        Mood and Affect: Mood normal.        Behavior: Behavior normal.        Thought Content: Thought content normal.        Judgment: Judgment normal.      BP 130/84   Pulse 84   Temp 98 F (36.7 C)   Ht 5' 9.75" (1.772 m)   Wt 246 lb 6.1 oz (111.8 kg)   SpO2 97%   BMI 35.61 kg/m  Wt Readings from Last 3 Encounters:  03/19/21 246 lb 6.1 oz (111.8 kg)  02/10/21 253 lb 6.1 oz (114.9 kg)  01/22/21 247 lb (112 kg)     Health Maintenance Due  Topic Date Due  . COVID-19 Vaccine (3 - Pfizer risk  4-dose series) 05/03/2020    There are no preventive care reminders to display for this patient.  Lab Results  Component Value Date   TSH 1.59 06/07/2019   Lab Results  Component Value Date   WBC 8.2 06/04/2019   HGB 13.9 06/04/2019   HCT 42.0 06/04/2019   MCV 88.7 06/04/2019   PLT 325.0 06/04/2019   Lab Results  Component Value Date   NA 138 06/04/2019   K 4.3 06/04/2019   CO2 27 06/04/2019   GLUCOSE 108 (H) 06/04/2019   BUN 9 06/04/2019   CREATININE 0.84 06/04/2019   BILITOT 0.5 06/04/2019   ALKPHOS 61 06/04/2019   AST 23 06/04/2019   ALT 34 06/04/2019   PROT 6.8 06/04/2019   ALBUMIN 4.5 06/04/2019   CALCIUM 9.8 06/04/2019   GFR 73.88 06/04/2019   Lab Results  Component Value Date   CHOL 274 (H) 06/04/2019   Lab Results  Component Value Date   HDL 38.80 (L) 06/04/2019   No results found for: [facility] Lab Results  Component Value Date   TRIG (H) 06/04/2019    411.0 Triglyceride is over 400; calculations on Lipids are invalid.   Lab Results  Component Value Date   CHOLHDL 7 06/04/2019   Lab Results  Component Value Date   HGBA1C 5.7 06/07/2019      Assessment & Plan:   Problem List Items Addressed This Visit   None   Visit Diagnoses    Class 2 obesity due to excess calories without serious comorbidity with body mass index (BMI) of 35.0 to 35.9 in adult    -  Primary   Relevant Medications   Naltrexone-buPROPion HCl ER (CONTRAVE) 8-90 MG TB12   phentermine (ADIPEX-P) 37.5 MG tablet   Encounter for weight loss counseling          Meds ordered this encounter  Medications  . Naltrexone-buPROPion HCl ER (CONTRAVE) 8-90 MG TB12    Sig: Start 1 tablet every morning for 7 days, then 1 tablet twice daily for 7 days, then 2 tablets every morning and one every evening    Dispense:  77 tablet    Refill:  0  . phentermine (ADIPEX-P) 37.5 MG tablet    Sig: Take 1 tablet (37.5 mg total) by mouth daily before breakfast.    Dispense:  30 tablet     Refill:  0    Follow-up: Return in  about 4 weeks (around 04/16/2021) for med / weight check with Lelon Mast .   1. Class 2 obesity due to excess calories without serious comorbidity with body mass index (BMI) of 35.0 to 35.9 in adult 2. Encounter for weight loss counseling Congratulated the patient on her weight loss.  I reviewed PDMP and everything was appropriate.  Refilled her last month of phentermine.  We also discussed several different options for longer-term weight loss medications including Contrave, Saxenda, and Ozempic.  She is hesitant about injection use and prefers to take a pill.  Therefore I sent Contrave to her pharmacy to see how well this medication might be covered for her.  Side effects and risks versus benefits discussed for each of these medications.  She knows to not start this medication until she is completely done with the phentermine.  This should give [place] enough time before her next appointment to see if Shona Simpson is covered and if not we can try for one of the other two.  I encouraged her to keep trying to get physical activity/aerobic exercise into daily habit for her.  Congratulated her on her stopping of sweet teas and sodas.  She will keep working on her nutrition as well.  Patient to call sooner if any concerns.  This note was prepared with assistance of Conservation officer, historic buildings. Occasional wrong-word or sound-a-like substitutions may have occurred due to the inherent limitations of voice recognition software. This visit occurred during the SARS-CoV-2 public health emergency.  Safety protocols were in place, including screening questions prior to the visit, additional usage of staff PPE, and extensive cleaning of exam room while observing appropriate contact time as indicated for disinfecting solutions.      Maedell Hedger M Kip Cropp, PA-C

## 2021-03-19 NOTE — Patient Instructions (Signed)
Doing great!! Keep up the good work. Refilled this last month of Phentermine. Will try to see if we can get Contrave approved to start on after the Phentermine. May also look into Ozempic or Saxenda if Contrave is not approved. F/up with Lelon Mast and then I will see you when I'm back from maternity leave.

## 2021-03-20 ENCOUNTER — Telehealth: Payer: Self-pay

## 2021-03-20 NOTE — Telephone Encounter (Signed)
[facility]achel [facility] outpatient pharmacy has a question about a medication that Rachel Curtis sent in yesterday for pt. Please call them back

## 2021-03-23 ENCOUNTER — Other Ambulatory Visit (HOSPITAL_COMMUNITY): Payer: Self-pay

## 2021-03-23 MED FILL — Naltrexone HCl-Bupropion HCl Tab ER 12HR 8-90 MG: ORAL | 28 days supply | Qty: 70 | Fill #0 | Status: CN

## 2021-03-23 NOTE — Telephone Encounter (Signed)
Spoke with pharmacy regarding dosage for Rx clarification, Done

## 2021-03-24 ENCOUNTER — Encounter: Payer: Self-pay | Admitting: Physician Assistant

## 2021-03-24 ENCOUNTER — Other Ambulatory Visit (HOSPITAL_COMMUNITY): Payer: Self-pay

## 2021-03-24 MED FILL — Ciclopirox Shampoo 1%: CUTANEOUS | 30 days supply | Qty: 120 | Fill #0 | Status: AC

## 2021-03-24 MED FILL — Clobetasol Propionate Soln 0.05%: CUTANEOUS | 30 days supply | Qty: 50 | Fill #0 | Status: AC

## 2021-03-25 ENCOUNTER — Other Ambulatory Visit (HOSPITAL_COMMUNITY): Payer: Self-pay

## 2021-03-26 ENCOUNTER — Other Ambulatory Visit (HOSPITAL_COMMUNITY): Payer: Self-pay

## 2021-04-14 ENCOUNTER — Ambulatory Visit: Payer: No Typology Code available for payment source | Admitting: Physician Assistant

## 2021-04-21 ENCOUNTER — Other Ambulatory Visit: Payer: Self-pay

## 2021-04-21 ENCOUNTER — Ambulatory Visit: Payer: No Typology Code available for payment source | Admitting: Obstetrics and Gynecology

## 2021-04-21 ENCOUNTER — Encounter: Payer: Self-pay | Admitting: Nurse Practitioner

## 2021-04-21 ENCOUNTER — Ambulatory Visit (INDEPENDENT_AMBULATORY_CARE_PROVIDER_SITE_OTHER): Payer: No Typology Code available for payment source | Admitting: Nurse Practitioner

## 2021-04-21 VITALS — BP 120/82 | HR 88 | Resp 16 | Ht 69.25 in | Wt 247.0 lb

## 2021-04-21 DIAGNOSIS — Z01419 Encounter for gynecological examination (general) (routine) without abnormal findings: Secondary | ICD-10-CM

## 2021-04-21 NOTE — Progress Notes (Signed)
45 y.o. G59P3004 Married White or Caucasian female here for annual exam.    Works as Forensic psychologist with Neuroscience with [employer] Has 4 daughters  Has had regular periods over 1 year now, feels hot natured, some night sweats, but not too bothersome at this time. Denies vaginal dryness  Is planning on going on weight loss medication soon. Waiting on insurance approval.  Period Duration (Days): 5 Period Pattern: Regular Menstrual Flow:  (heavy to moderate) Menstrual Control: Tampon Dysmenorrhea:  (moderate to severe cramping 1st day) Patient's last menstrual period was 03/30/2021 (exact date).            Sexually active: Yes.    The current method of family planning is vasectomy.    Exercising: No.  exercise Smoker:  no  Health Maintenance: Pap:  02-28-2020 neg History of abnormal Pap:  no MMG:  none Colonoscopy:  none BMD:   none Gardasil:   n/a Covid-19: pfizer Hep C testing: not done Screening Labs: states will go to PCP   reports that she has never smoked. She has never used smokeless tobacco. She reports current alcohol use. She reports that she does not use drugs.  Past Medical History:  Diagnosis Date  . Allergy   . Anxiety   . History of chickenpox     Past Surgical History:  Procedure Laterality Date  . CHOLECYSTECTOMY  2001  . WISDOM TOOTH EXTRACTION  1999    Current Outpatient Medications  Medication Sig Dispense Refill  . Ciclopirox 1 % shampoo APPLY TOPICALLY TO AFFECTED AREA AS NEEDED. 120 mL 1  . clobetasol (TEMOVATE) 0.05 % external solution APPLY TOPICALLY TO AFFECTED AREA TWICE DAILY AS NEEDED. 50 mL 1  . phentermine (ADIPEX-P) 37.5 MG tablet Take 1 tablet (37.5 mg total) by mouth daily before breakfast. 30 tablet 0  . Naltrexone-buPROPion HCl ER 8-90 MG TB12 TAKE 1 TAB BY MOUTH EVERY MORNING FOR 7 DAYS, THEN TAKE 1 TAB TWICE DAILY x7 DAYS, THEN TAKE 2 TABS EVERY MORNING AND 1 EVERY EVENING X7DAYS, THEN 2 TABS TWICE DAILY  (Patient not taking: Reported on 04/21/2021) 77 tablet 0   No current facility-administered medications for this visit.    Family History  Problem Relation Age of Onset  . Hyperlipidemia Mother   . Hypertension Mother   . Diabetes Father   . Heart disease Father   . Arthritis Father   . Breast cancer Maternal Aunt   . Diabetes Maternal Grandmother   . Dementia Paternal Grandmother   . Alcohol abuse Paternal Grandfather   . Cancer Maternal Uncle        colon ca    Review of Systems  Constitutional: Negative.   HENT: Negative.   Eyes: Negative.   Respiratory: Negative.   Cardiovascular: Negative.   Gastrointestinal: Negative.   Endocrine: Negative.   Genitourinary: Negative.   Musculoskeletal: Negative.   Skin: Negative.   Allergic/Immunologic: Negative.   Neurological: Negative.   Hematological: Negative.   Psychiatric/Behavioral: Negative.     Exam:   BP 120/82   Pulse 88   Resp 16   Ht 5' 9.25" (1.759 m)   Wt 247 lb (112 kg)   LMP 03/30/2021 (Exact Date)   BMI 36.21 kg/m   Height: 5' 9.25" (175.9 cm)  General appearance: alert, cooperative and appears stated age, no acute distress Head: Normocephalic, without obvious abnormality Neck: no adenopathy, thyroid normal to inspection and palpation Lungs: clear to auscultation bilaterally Breasts: No axillary or supraclavicular adenopathy, Normal  to palpation without dominant masses Heart: regular rate and rhythm Abdomen: soft, non-tender; no masses,  no organomegaly Extremities: extremities normal, no edema Skin: No rashes or lesions Lymph nodes: Cervical, supraclavicular, and axillary nodes normal. No abnormal inguinal nodes palpated Neurologic: Grossly normal   Pelvic: External genitalia:  no lesions. Right labia slightly larger than left, has small polypoid lesion on left labia, not bothersome at this time              Urethra:  normal appearing urethra with no masses, tenderness or lesions               Bartholins and Skenes: normal                 Vagina: normal appearing vagina, appropriate for age, normal appearing discharge, no lesions              Cervix: neg cervical motion tenderness, no visible lesions             Bimanual Exam:   Uterus:  normal size, contour, position, consistency, mobility, non-tender              Adnexa: no mass, fullness, tenderness                 A:  Well Woman with normal exam  P:   Pap : done last year, WNL, due 2026  Mammogram: pt will schedule this year. Has never had one. Phone numbers provided to [facility]: will do with PCP  Medications: no new  Discussed new guidelines for colonoscopy. Pt considered cologuard, but may wait until next year.

## 2021-04-21 NOTE — Progress Notes (Deleted)
45 y.o. G25P3004 Married Caucasian female here for annual exam.    PCP:     No LMP recorded. (Menstrual status: Irregular Periods).           Sexually active: {yes no:314532}  The current method of family planning is vasectomy.    Exercising: {yes no:314532}  {types:19826} Smoker:  no  Health Maintenance: Pap: 02-28-20 Neg:Neg HR HPV, 2014 Neg History of abnormal Pap:  no MMG: ***NEVER Colonoscopy:  ***NEVER BMD:   n/a  Result  n/a TDaP:  12-02-14 Gardasil:   no HIV: 06-04-19 NR Hep C: no Screening Labs:  Hb today: ***, Urine today: ***   reports that she has never smoked. She has never used smokeless tobacco. She reports current alcohol use. She reports that she does not use drugs.  Past Medical History:  Diagnosis Date  . Allergy   . Anxiety   . History of chickenpox     Past Surgical History:  Procedure Laterality Date  . CHOLECYSTECTOMY  2001  . WISDOM TOOTH EXTRACTION  1999    Current Outpatient Medications  Medication Sig Dispense Refill  . Ciclopirox 1 % shampoo APPLY TOPICALLY TO AFFECTED AREA AS NEEDED. 120 mL 1  . clobetasol (TEMOVATE) 0.05 % external solution APPLY TOPICALLY TO AFFECTED AREA TWICE DAILY AS NEEDED. 50 mL 1  . Naltrexone-buPROPion HCl ER 8-90 MG TB12 TAKE 1 TAB BY MOUTH EVERY MORNING FOR 7 DAYS, THEN TAKE 1 TAB TWICE DAILY x7 DAYS, THEN TAKE 2 TABS EVERY MORNING AND 1 EVERY EVENING X7DAYS, THEN 2 TABS TWICE DAILY 77 tablet 0  . phentermine (ADIPEX-P) 37.5 MG tablet Take 1 tablet (37.5 mg total) by mouth daily before breakfast. 30 tablet 0   No current facility-administered medications for this visit.    Family History  Problem Relation Age of Onset  . Hyperlipidemia Mother   . Hypertension Mother   . Diabetes Father   . Heart disease Father   . Arthritis Father   . Breast cancer Maternal Aunt   . Diabetes Maternal Grandmother   . Dementia Paternal Grandmother   . Alcohol abuse Paternal Grandfather   . Cancer Maternal Uncle         colon ca    Review of Systems  Exam:   There were no vitals taken for this visit.    General appearance: alert, cooperative and appears stated age Head: normocephalic, without obvious abnormality, atraumatic Neck: no adenopathy, supple, symmetrical, trachea midline and thyroid normal to inspection and palpation Lungs: clear to auscultation bilaterally Breasts: normal appearance, no masses or tenderness, No nipple retraction or dimpling, No nipple discharge or bleeding, No axillary adenopathy Heart: regular rate and rhythm Abdomen: soft, non-tender; no masses, no organomegaly Extremities: extremities normal, atraumatic, no cyanosis or edema Skin: skin color, texture, turgor normal. No rashes or lesions Lymph nodes: cervical, supraclavicular, and axillary nodes normal. Neurologic: grossly normal  Pelvic: External genitalia:  no lesions              No abnormal inguinal nodes palpated.              Urethra:  normal appearing urethra with no masses, tenderness or lesions              Bartholins and Skenes: normal                 Vagina: normal appearing vagina with normal color and discharge, no lesions  Cervix: no lesions              Pap taken: {yes no:314532} Bimanual Exam:  Uterus:  normal size, contour, position, consistency, mobility, non-tender              Adnexa: no mass, fullness, tenderness              Rectal exam: {yes no:314532}.  Confirms.              Anus:  normal sphincter tone, no lesions  Chaperone was present for exam.  Assessment:   Well woman visit with normal exam.   Plan: Mammogram screening discussed. Self breast awareness reviewed. Pap and HR HPV as above. Guidelines for Calcium, Vitamin D, regular exercise program including cardiovascular and weight bearing exercise.   Follow up annually and prn.   Additional counseling given.  {yes [id number]. _______ minutes face to face time of which over 50% was spent in counseling.    After  visit summary provided.

## 2021-04-21 NOTE — Patient Instructions (Signed)
Health Maintenance, Female Adopting a healthy lifestyle and getting preventive care are important in promoting health and wellness. Ask your health care provider about:  The right schedule for you to have regular tests and exams.  Things you can do on your own to prevent diseases and keep yourself healthy. What should I know about diet, weight, and exercise? Eat a healthy diet  Eat a diet that includes plenty of vegetables, fruits, low-fat dairy products, and lean protein.  Do not eat a lot of foods that are high in solid fats, added sugars, or sodium.   Maintain a healthy weight Body mass index (BMI) is used to identify weight problems. It estimates body fat based on height and weight. Your health care provider can help determine your BMI and help you achieve or maintain a healthy weight. Get regular exercise Get regular exercise. This is one of the most important things you can do for your health. Most adults should:  Exercise for at least 150 minutes each week. The exercise should increase your heart rate and make you sweat (moderate-intensity exercise).  Do strengthening exercises at least twice a week. This is in addition to the moderate-intensity exercise.  Spend less time sitting. Even light physical activity can be beneficial. Watch cholesterol and blood lipids Have your blood tested for lipids and cholesterol at 45 years of age, then have this test every 5 years. Have your cholesterol levels checked more often if:  Your lipid or cholesterol levels are high.  You are older than 45 years of age.  You are at high risk for heart disease. What should I know about cancer screening? Depending on your health history and family history, you may need to have cancer screening at various ages. This may include screening for:  Breast cancer.  Cervical cancer.  Colorectal cancer.  Skin cancer.  Lung cancer. What should I know about heart disease, diabetes, and high blood  pressure? Blood pressure and heart disease  High blood pressure causes heart disease and increases the risk of stroke. This is more likely to develop in people who have high blood pressure readings, are of African descent, or are overweight.  Have your blood pressure checked: ? Every 3-5 years if you are 18-39 years of age. ? Every year if you are 40 years old or older. Diabetes Have regular diabetes screenings. This checks your fasting blood sugar level. Have the screening done:  Once every three years after age 40 if you are at a normal weight and have a low risk for diabetes.  More often and at a younger age if you are overweight or have a high risk for diabetes. What should I know about preventing infection? Hepatitis B If you have a higher risk for hepatitis B, you should be screened for this virus. Talk with your health care provider to find out if you are at risk for hepatitis B infection. Hepatitis C Testing is recommended for:  Everyone born from 1945 through 1965.  Anyone with known risk factors for hepatitis C. Sexually transmitted infections (STIs)  Get screened for STIs, including gonorrhea and chlamydia, if: ? You are sexually active and are younger than 45 years of age. ? You are older than 45 years of age and your health care provider tells you that you are at risk for this type of infection. ? Your sexual activity has changed since you were last screened, and you are at increased risk for chlamydia or gonorrhea. Ask your health care provider   if you are at risk.  Ask your health care provider about whether you are at high risk for HIV. Your health care provider may recommend a prescription medicine to help prevent HIV infection. If you choose to take medicine to prevent HIV, you should first get tested for HIV. You should then be tested every 3 months for as long as you are taking the medicine. Pregnancy  If you are about to stop having your period (premenopausal) and  you may become pregnant, seek counseling before you get pregnant.  Take 400 to 800 micrograms (mcg) of folic acid every day if you become pregnant.  Ask for birth control (contraception) if you want to prevent pregnancy. Osteoporosis and menopause Osteoporosis is a disease in which the bones lose minerals and strength with aging. This can result in bone fractures. If you are 65 years old or older, or if you are at risk for osteoporosis and fractures, ask your health care provider if you should:  Be screened for bone loss.  Take a calcium or vitamin D supplement to lower your risk of fractures.  Be given hormone replacement therapy (HRT) to treat symptoms of menopause. Follow these instructions at home: Lifestyle  Do not use any products that contain nicotine or tobacco, such as cigarettes, e-cigarettes, and chewing tobacco. If you need help quitting, ask your health care provider.  Do not use street drugs.  Do not share needles.  Ask your health care provider for help if you need support or information about quitting drugs. Alcohol use  Do not drink alcohol if: ? Your health care provider tells you not to drink. ? You are pregnant, may be pregnant, or are planning to become pregnant.  If you drink alcohol: ? Limit how much you use to 0-1 drink a day. ? Limit intake if you are breastfeeding.  Be aware of how much alcohol is in your drink. In the [place] one drink equals one 12 oz bottle of beer (355 mL), one 5 oz glass of wine (148 mL), or one 1 oz glass of hard liquor (44 mL). General instructions  Schedule regular health, dental, and eye exams.  Stay current with your vaccines.  Tell your health care provider if: ? You often feel depressed. ? You have ever been abused or do not feel safe at home. Summary  Adopting a healthy lifestyle and getting preventive care are important in promoting health and wellness.  Follow your health care provider's instructions about healthy  diet, exercising, and getting tested or screened for diseases.  Follow your health care provider's instructions on monitoring your cholesterol and blood pressure. This information is not intended to replace advice given to you by your health care provider. Make sure you discuss any questions you have with your health care provider. Document Revised: 11/29/2018 Document Reviewed: 11/29/2018 Elsevier Patient Education  2021 [employer].  

## 2021-04-29 ENCOUNTER — Other Ambulatory Visit (HOSPITAL_COMMUNITY): Payer: Self-pay

## 2021-04-29 MED FILL — Naltrexone HCl-Bupropion HCl Tab ER 12HR 8-90 MG: ORAL | 28 days supply | Qty: 77 | Fill #0 | Status: CN

## 2021-05-05 ENCOUNTER — Other Ambulatory Visit: Payer: Self-pay

## 2021-05-21 ENCOUNTER — Other Ambulatory Visit (HOSPITAL_COMMUNITY): Payer: Self-pay

## 2021-07-16 ENCOUNTER — Other Ambulatory Visit: Payer: Self-pay | Admitting: Physician Assistant

## 2021-07-16 DIAGNOSIS — H66001 Acute suppurative otitis media without spontaneous rupture of ear drum, right ear: Secondary | ICD-10-CM | POA: Insufficient documentation

## 2021-07-16 DIAGNOSIS — T7840XA Allergy, unspecified, initial encounter: Secondary | ICD-10-CM | POA: Insufficient documentation

## 2021-07-16 MED ORDER — AMOXICILLIN-POT CLAVULANATE 875-125 MG PO TABS: 1.0000 | ORAL_TABLET | Freq: Two times a day (BID) | ORAL | 0 refills | Status: DC

## 2021-07-16 NOTE — Progress Notes (Signed)
Patient had an [facility] visit. Rx not working so prescription sent via Epic.

## 2021-08-04 NOTE — Progress Notes (Deleted)
GYNECOLOGY  VISIT   HPI: 45 y.o.   Married  Caucasian  female   [phone] with No LMP recorded.   here for     GYNECOLOGIC HISTORY: No LMP recorded. Contraception: Vasectomy Menopausal hormone therapy: ***None Last mammogram:  ***None Last pap smear: 02-28-2020 neg        OB History     Gravida  3   Para  3   Term  3   Preterm      AB      Living  4      SAB      IAB      Ectopic      Multiple  1   Live Births  4              Patient Active Problem List   Diagnosis Date Noted   Allergic rhinitis due to pollen 01/12/2018   Eustachian tube dysfunction 01/12/2018   TMJ (dislocation of temporomandibular joint) 01/12/2018   Obesity (BMI 30.0-34.9) 04/28/2017    Past Medical History:  Diagnosis Date   Allergy    Anxiety    History of chickenpox     Past Surgical History:  Procedure Laterality Date   CHOLECYSTECTOMY  2001   WISDOM TOOTH EXTRACTION  1999    Current Outpatient Medications  Medication Sig Dispense Refill   amoxicillin-clavulanate (AUGMENTIN) 875-125 MG tablet Take 1 tablet by mouth 2 (two) times daily. 14 tablet 0   Ciclopirox 1 % shampoo APPLY TOPICALLY TO AFFECTED AREA AS NEEDED. 120 mL 1   clobetasol (TEMOVATE) 0.05 % external solution APPLY TOPICALLY TO AFFECTED AREA TWICE DAILY AS NEEDED. 50 mL 1   Naltrexone-buPROPion HCl ER 8-90 MG TB12 TAKE 1 TAB BY MOUTH EVERY MORNING FOR 7 DAYS, THEN TAKE 1 TAB TWICE DAILY x7 DAYS, THEN TAKE 2 TABS EVERY MORNING AND 1 EVERY EVENING X7DAYS, THEN 2 TABS TWICE DAILY (Patient not taking: Reported on 04/21/2021) 77 tablet 0   phentermine (ADIPEX-P) 37.5 MG tablet Take 1 tablet (37.5 mg total) by mouth daily before breakfast. 30 tablet 0   No current facility-administered medications for this visit.     ALLERGIES: Patient has no known allergies.  Family History  Problem Relation Age of Onset   Hyperlipidemia Mother    Hypertension Mother    Diabetes Father    Heart disease Father    Arthritis  Father    Breast cancer Maternal Aunt    Diabetes Maternal Grandmother    Dementia Paternal Grandmother    Alcohol abuse Paternal Grandfather    Cancer Maternal Uncle        colon ca    Social History   Socioeconomic History   Marital status: Married    Spouse name: Alinda Money   Number of children: 4   Years of education: Not on file   Highest education level: Not on file  Occupational History   Occupation: Magazine features editor: [employer]  Tobacco Use   Smoking status: Never   Smokeless tobacco: Never  Vaping Use   Vaping Use: Never used  Substance and Sexual Activity   Alcohol use: Yes    Comment: 1 drink/month   Drug use: No   Sexual activity: Yes    Birth control/protection: Other-see comments    Comment: vasectomy in husband  Other Topics Concern   Not on file  Social History Narrative   Not on file   Social Determinants of Health   Financial Resource Strain:  Not on file  Food Insecurity: Not on file  Transportation Needs: Not on file  Physical Activity: Not on file  Stress: Not on file  Social Connections: Not on file  Intimate Partner Violence: Not on file    Review of Systems  PHYSICAL EXAMINATION:    There were no vitals taken for this visit.    General appearance: alert, cooperative and appears stated age Head: Normocephalic, without obvious abnormality, atraumatic Neck: no adenopathy, supple, symmetrical, trachea midline and thyroid normal to inspection and palpation Lungs: clear to auscultation bilaterally Breasts: normal appearance, no masses or tenderness, No nipple retraction or dimpling, No nipple discharge or bleeding, No axillary or supraclavicular adenopathy Heart: regular rate and rhythm Abdomen: soft, non-tender, no masses,  no organomegaly Extremities: extremities normal, atraumatic, no cyanosis or edema Skin: Skin color, texture, turgor normal. No rashes or lesions Lymph nodes: Cervical, supraclavicular, and axillary nodes normal. No  abnormal inguinal nodes palpated Neurologic: Grossly normal  Pelvic: External genitalia:  no lesions              Urethra:  normal appearing urethra with no masses, tenderness or lesions              Bartholins and Skenes: normal                 Vagina: normal appearing vagina with normal color and discharge, no lesions              Cervix: no lesions                Bimanual Exam:  Uterus:  normal size, contour, position, consistency, mobility, non-tender              Adnexa: no mass, fullness, tenderness              Rectal exam: {yes no:[id number]}.  Confirms.              Anus:  normal sphincter tone, no lesions  Chaperone was present for exam:  ***  ASSESSMENT     PLAN     An After Visit Summary was printed and given to the patient.  ______ minutes face to face time of which over 50% was spent in counseling.

## 2021-08-05 ENCOUNTER — Encounter: Payer: Self-pay | Admitting: Obstetrics and Gynecology

## 2021-08-05 ENCOUNTER — Other Ambulatory Visit: Payer: Self-pay

## 2021-08-05 ENCOUNTER — Ambulatory Visit: Payer: No Typology Code available for payment source | Admitting: Obstetrics and Gynecology

## 2021-08-05 ENCOUNTER — Ambulatory Visit (INDEPENDENT_AMBULATORY_CARE_PROVIDER_SITE_OTHER): Payer: No Typology Code available for payment source | Admitting: Obstetrics and Gynecology

## 2021-08-05 VITALS — BP 126/82 | HR 108 | Ht 69.25 in | Wt 257.0 lb

## 2021-08-05 DIAGNOSIS — N926 Irregular menstruation, unspecified: Secondary | ICD-10-CM

## 2021-08-05 DIAGNOSIS — N951 Menopausal and female climacteric states: Secondary | ICD-10-CM | POA: Diagnosis not present

## 2021-08-05 LAB — PREGNANCY, URINE: Preg Test, Ur: NEGATIVE

## 2021-08-05 NOTE — Progress Notes (Signed)
GYNECOLOGY  VISIT   HPI: 45 y.o.   Married  Caucasian  female   [phone] with Patient's last menstrual period was 04/26/2021 (exact date).   here for missed menses. Feels like she is going to start but does not. Having cramping, mood swing, hot flashes, mood swings and abdominal bloating  Feels hot all the time.  Not sleeping well.  Exhausted.  Moody. Gained weight.  She has some joint pain.   Normal intestinal function.   Had emotional changes in the past with birth control pills, but this was a long time ago. Denies HTN, smoking, migraine with aura, personal or FH thromboembolic events, liver or breast disease.   Took Lexapro in the past and she felt zoned out.   Twins are 47 and has a 46 yo.   UPT negative.  GYNECOLOGIC HISTORY: Patient's last menstrual period was 04/26/2021 (exact date). Contraception:  Vasectomy Menopausal hormone therapy:  none Last mammogram:  Last pap smear: 02-28-20 Neg:Neg HR HPV        OB History     Gravida  3   Para  3   Term  3   Preterm      AB      Living  4      SAB      IAB      Ectopic      Multiple  1   Live Births  4              Patient Active Problem List   Diagnosis Date Noted   Allergic rhinitis due to pollen 01/12/2018   Eustachian tube dysfunction 01/12/2018   TMJ (dislocation of temporomandibular joint) 01/12/2018   Obesity (BMI 30.0-34.9) 04/28/2017    Past Medical History:  Diagnosis Date   Allergy    Anxiety    History of chickenpox     Past Surgical History:  Procedure Laterality Date   CHOLECYSTECTOMY  2001   WISDOM TOOTH EXTRACTION  1999    Current Outpatient Medications  Medication Sig Dispense Refill   amoxicillin-clavulanate (AUGMENTIN) 875-125 MG tablet Take 1 tablet by mouth 2 (two) times daily. 14 tablet 0   Ciclopirox 1 % shampoo APPLY TOPICALLY TO AFFECTED AREA AS NEEDED. 120 mL 1   clobetasol (TEMOVATE) 0.05 % external solution APPLY TOPICALLY TO AFFECTED AREA TWICE DAILY  AS NEEDED. 50 mL 1   Naltrexone-buPROPion HCl ER 8-90 MG TB12 TAKE 1 TAB BY MOUTH EVERY MORNING FOR 7 DAYS, THEN TAKE 1 TAB TWICE DAILY x7 DAYS, THEN TAKE 2 TABS EVERY MORNING AND 1 EVERY EVENING X7DAYS, THEN 2 TABS TWICE DAILY (Patient not taking: Reported on 04/21/2021) 77 tablet 0   phentermine (ADIPEX-P) 37.5 MG tablet Take 1 tablet (37.5 mg total) by mouth daily before breakfast. 30 tablet 0   No current facility-administered medications for this visit.     ALLERGIES: Patient has no known allergies.  Family History  Problem Relation Age of Onset   Hyperlipidemia Mother    Hypertension Mother    Diabetes Father    Heart disease Father    Arthritis Father    Breast cancer Maternal Aunt    Diabetes Maternal Grandmother    Dementia Paternal Grandmother    Alcohol abuse Paternal Grandfather    Cancer Maternal Uncle        colon ca    Social History   Socioeconomic History   Marital status: Married    Spouse name: Alinda Money   Number of children: 4  Years of education: Not on file   Highest education level: Not on file  Occupational History   Occupation: Magazine features editor: [employer]  Tobacco Use   Smoking status: Never   Smokeless tobacco: Never  Vaping Use   Vaping Use: Never used  Substance and Sexual Activity   Alcohol use: Yes    Comment: 1 drink/month   Drug use: No   Sexual activity: Yes    Birth control/protection: Other-see comments    Comment: vasectomy in husband  Other Topics Concern   Not on file  Social History Narrative   Not on file   Social Determinants of Health   Financial Resource Strain: Not on file  Food Insecurity: Not on file  Transportation Needs: Not on file  Physical Activity: Not on file  Stress: Not on file  Social Connections: Not on file  Intimate Partner Violence: Not on file    Review of Systems  Genitourinary:        Abdominal cramping and abdominal bloating  Skin:        Hot flashes   Psychiatric/Behavioral:          Mood swings  All other systems reviewed and are negative.  PHYSICAL EXAMINATION:    Ht 5' 9.25" (1.759 m)   Wt 247 lb (112 kg)   LMP 04/26/2021 (Exact Date)   BMI 36.21 kg/m     General appearance: alert, cooperative and appears stated age  ASSESSMENT  Menopausal symptoms.  Skipped menses.  PLAN  Check FSH and E2.  If levels look perimenopausal, she is open to going on low dose combined birth control. Otherwise she is wanting to do HRT.  I did discuss thromboembolic risk with use of HRT and combined birth control pills. She is going to update her mammogram. She declines SSRI or SNRI. Brochure on menopause.    An After Visit Summary was printed and given to the patient.  28 min  total time was spent for this patient encounter, including preparation, face-to-face counseling with the patient, coordination of care, and documentation of the encounter.

## 2021-08-07 ENCOUNTER — Other Ambulatory Visit (HOSPITAL_COMMUNITY): Payer: Self-pay

## 2021-08-07 ENCOUNTER — Other Ambulatory Visit: Payer: Self-pay | Admitting: *Deleted

## 2021-08-07 MED ORDER — DESOGESTREL-ETHINYL ESTRADIOL 0.15-0.02/0.01 MG (21/5) PO TABS
1.0000 | ORAL_TABLET | Freq: Every day | ORAL | 0 refills | Status: DC
  Filled 2021-08-07: qty 84, 84d supply, fill #0

## 2021-08-08 LAB — ESTRADIOL: Estradiol: 50 pg/mL

## 2021-08-08 LAB — FOLLICLE STIMULATING HORMONE: FSH: 7.2 m[IU]/mL

## 2021-08-08 LAB — TSH: TSH: 2.84 mIU/L

## 2021-08-08 LAB — PROLACTIN: Prolactin: 16.6 ng/mL

## 2021-09-22 ENCOUNTER — Encounter: Payer: Self-pay | Admitting: Obstetrics and Gynecology

## 2021-09-23 ENCOUNTER — Other Ambulatory Visit (HOSPITAL_COMMUNITY): Payer: Self-pay

## 2021-09-23 ENCOUNTER — Other Ambulatory Visit: Payer: Self-pay

## 2021-09-23 ENCOUNTER — Ambulatory Visit (INDEPENDENT_AMBULATORY_CARE_PROVIDER_SITE_OTHER): Payer: No Typology Code available for payment source

## 2021-09-23 ENCOUNTER — Encounter: Payer: Self-pay | Admitting: Obstetrics and Gynecology

## 2021-09-23 ENCOUNTER — Ambulatory Visit (INDEPENDENT_AMBULATORY_CARE_PROVIDER_SITE_OTHER): Payer: No Typology Code available for payment source | Admitting: Obstetrics and Gynecology

## 2021-09-23 VITALS — BP 160/80 | HR 100 | Ht 69.25 in | Wt 247.0 lb

## 2021-09-23 DIAGNOSIS — N921 Excessive and frequent menstruation with irregular cycle: Secondary | ICD-10-CM | POA: Diagnosis not present

## 2021-09-23 DIAGNOSIS — Z23 Encounter for immunization: Secondary | ICD-10-CM

## 2021-09-23 LAB — CBC WITH DIFFERENTIAL/PLATELET
Absolute Monocytes: 618 cells/uL (ref 200–950)
Basophils Absolute: 26 cells/uL (ref 0–200)
Basophils Relative: 0.3 %
Eosinophils Absolute: 96 cells/uL (ref 15–500)
Eosinophils Relative: 1.1 %
HCT: 39.4 % (ref 35.0–45.0)
Hemoglobin: 13.4 g/dL (ref 11.7–15.5)
Lymphs Abs: 2923 cells/uL (ref 850–3900)
MCH: 29.9 pg (ref 27.0–33.0)
MCHC: 34 g/dL (ref 32.0–36.0)
MCV: 87.9 fL (ref 80.0–100.0)
MPV: 10.9 fL (ref 7.5–12.5)
Monocytes Relative: 7.1 %
Neutro Abs: 5037 cells/uL (ref 1500–7800)
Neutrophils Relative %: 57.9 %
Platelets: 327 10*3/uL (ref 140–400)
RBC: 4.48 10*6/uL (ref 3.80–5.10)
RDW: 11.8 % (ref 11.0–15.0)
Total Lymphocyte: 33.6 %
WBC: 8.7 10*3/uL (ref 3.8–10.8)

## 2021-09-23 MED ORDER — NORETHINDRONE ACETATE 5 MG PO TABS
5.0000 mg | ORAL_TABLET | Freq: Two times a day (BID) | ORAL | 0 refills | Status: DC
  Filled 2021-09-23: qty 60, 30d supply, fill #0

## 2021-09-23 NOTE — Progress Notes (Addendum)
GYNECOLOGY  VISIT   HPI: 45 y.o.   Married  Caucasian  female   [phone] with No LMP recorded.   here for heavy irregular bleeding since 08-24-21. She is having accidents. Passing clots and having constant small cramps.  Patient states prior to 08-24-21 her last cycle was 03/2021.  Using a pad and tampon. Changing tampon three times a day.  Taking Aleve before going to bed just to help sleep.   Hot flashes are worse.   On her birth control for 1.5 months.   Retaining fluid.  Tired, and irritable.   Used Mirena IUD 20 years ago.  She felt a little ill but did OK.  She did switch to ParaGard IUD later.   Going camping next weekend.    GYNECOLOGIC HISTORY: No LMP recorded. Contraception:  Vasectomy/OCPs Menopausal hormone therapy:  none Last mammogram: None Last pap smear:  02-28-20 Neg:Neg HR HPV        OB History     Gravida  3   Para  3   Term  3   Preterm      AB      Living  4      SAB      IAB      Ectopic      Multiple  1   Live Births  4              Patient Active Problem List   Diagnosis Date Noted   Allergic rhinitis due to pollen 01/12/2018   Eustachian tube dysfunction 01/12/2018   TMJ (dislocation of temporomandibular joint) 01/12/2018   Obesity (BMI 30.0-34.9) 04/28/2017    Past Medical History:  Diagnosis Date   Allergy    Anxiety    History of chickenpox     Past Surgical History:  Procedure Laterality Date   CHOLECYSTECTOMY  2001   WISDOM TOOTH EXTRACTION  1999    Current Outpatient Medications  Medication Sig Dispense Refill   Ciclopirox 1 % shampoo APPLY TOPICALLY TO AFFECTED AREA AS NEEDED. 120 mL 1   clobetasol (TEMOVATE) 0.05 % external solution APPLY TOPICALLY TO AFFECTED AREA TWICE DAILY AS NEEDED. 50 mL 1   desogestrel-ethinyl estradiol (MIRCETTE) 0.15-0.02/0.01 MG (21/5) tablet Take 1 tablet by mouth daily. 84 tablet 0   No current facility-administered medications for this visit.     ALLERGIES: Patient  has no known allergies.  Family History  Problem Relation Age of Onset   Hyperlipidemia Mother    Hypertension Mother    Diabetes Father    Heart disease Father    Arthritis Father    Breast cancer Maternal Aunt    Diabetes Maternal Grandmother    Dementia Paternal Grandmother    Alcohol abuse Paternal Grandfather    Cancer Maternal Uncle        colon ca    Social History   Socioeconomic History   Marital status: Married    Spouse name: Alinda Money   Number of children: 4   Years of education: Not on file   Highest education level: Not on file  Occupational History   Occupation: Magazine features editor: [employer]  Tobacco Use   Smoking status: Never   Smokeless tobacco: Never  Vaping Use   Vaping Use: Never used  Substance and Sexual Activity   Alcohol use: Yes    Comment: 1 drink/month   Drug use: No   Sexual activity: Yes    Birth control/protection: Other-see comments  Comment: vasectomy in husband  Other Topics Concern   Not on file  Social History Narrative   Not on file   Social Determinants of Health   Financial Resource Strain: Not on file  Food Insecurity: Not on file  Transportation Needs: Not on file  Physical Activity: Not on file  Stress: Not on file  Social Connections: Not on file  Intimate Partner Violence: Not on file    Review of Systems  Genitourinary:  Positive for vaginal bleeding (heavy irregular bleeding with cramping and clots x4 weeks).  All other systems reviewed and are negative.  PHYSICAL EXAMINATION:    BP (!) 160/80   Pulse 100   Ht 5' 9.25" (1.759 m)   Wt 247 lb (112 kg)   SpO2 98%   BMI 36.21 kg/m    repeat BP 130/80. General appearance: alert, cooperative and appears stated age  Pelvic: External genitalia:  no lesions              Urethra:  normal appearing urethra with no masses, tenderness or lesions              Bartholins and Skenes: normal                 Vagina: normal appearing vagina with normal color and  discharge, no lesions.  Blood stained vagina.               Cervix: no lesions                Bimanual Exam:  Uterus:  normal size, contour, position, consistency, mobility, non-tender              Adnexa: no mass, fullness, tenderness            Chaperone was present for exam:  Marchelle Folks, CMA.  ASSESSMENT  Menorrhagia with irregular menses on low dose COCs.  Elevated blood pressure reading.  LE edema.   PLAN  CBC. Stop Mircette.  Start Aygestin 5 mg po bid.  #60, RF none.  Mammogram. Return for pelvic ultrasound and possible endometrial biopsy.  Will consider Mirena IUD.    An After Visit Summary was printed and given to the patient.  23 min  total time was spent for this patient encounter, including preparation, face-to-face counseling with the patient, coordination of care, and documentation of the encounter.

## 2021-09-23 NOTE — Telephone Encounter (Signed)
Please offer the patient an office visit with me today if possible.  I would like to examine her, discuss options, and do some blood work.  Please try to use a slot before 4:30 pm. Thank you.

## 2021-09-28 ENCOUNTER — Other Ambulatory Visit: Payer: Self-pay | Admitting: Obstetrics and Gynecology

## 2021-09-28 DIAGNOSIS — Z1231 Encounter for screening mammogram for malignant neoplasm of breast: Secondary | ICD-10-CM

## 2021-09-29 ENCOUNTER — Ambulatory Visit
Admission: RE | Admit: 2021-09-29 | Discharge: 2021-09-29 | Disposition: A | Discharge status: Home / Self Care | Payer: No Typology Code available for payment source | Source: Ambulatory Visit | Attending: Obstetrics and Gynecology | Admitting: Obstetrics and Gynecology

## 2021-09-29 ENCOUNTER — Other Ambulatory Visit: Payer: Self-pay

## 2021-09-29 DIAGNOSIS — Z1231 Encounter for screening mammogram for malignant neoplasm of breast: Secondary | ICD-10-CM

## 2021-10-01 ENCOUNTER — Other Ambulatory Visit: Payer: No Typology Code available for payment source

## 2021-10-01 ENCOUNTER — Telehealth: Payer: Self-pay

## 2021-10-01 ENCOUNTER — Ambulatory Visit: Payer: No Typology Code available for payment source | Admitting: Obstetrics and Gynecology

## 2021-10-01 ENCOUNTER — Other Ambulatory Visit: Payer: No Typology Code available for payment source | Admitting: Obstetrics and Gynecology

## 2021-10-01 NOTE — Telephone Encounter (Signed)
Patient reports that she was scheduled this afternoon for an u/s and to discuss possible Mirena IUD after the u/s.  She cancelled the u/s because of her cost. She called to ask if you would proceed to the Mirena without the u/s. She states she wants to avoid another copayment for visit and wants you to order so the next time she comes in she has the IUD inserted.

## 2021-10-01 NOTE — Telephone Encounter (Signed)
How is her bleeding doing since she stopped her birth control pills and started the Aygestin 5 mg po bid?

## 2021-10-05 ENCOUNTER — Other Ambulatory Visit: Payer: Self-pay | Admitting: Obstetrics and Gynecology

## 2021-10-05 DIAGNOSIS — R928 Other abnormal and inconclusive findings on diagnostic imaging of breast: Secondary | ICD-10-CM

## 2021-10-05 NOTE — Telephone Encounter (Signed)
Ok to proceed with Mirena IUD insertion.  This will need precert.  Diagnosis is menorrhagia with irregular menses.

## 2021-10-05 NOTE — Telephone Encounter (Signed)
Patient said her bleeding is doing much better, reports only having dark discharge when wiping only.

## 2021-10-05 NOTE — Telephone Encounter (Signed)
Left message for patient to call.

## 2021-10-05 NOTE — Telephone Encounter (Signed)
Patient informed. Aware Rachel Curtis will be reaching out for with insurance information.

## 2021-10-12 ENCOUNTER — Other Ambulatory Visit: Payer: Self-pay | Admitting: Obstetrics and Gynecology

## 2021-10-12 DIAGNOSIS — Z30430 Encounter for insertion of intrauterine contraceptive device: Secondary | ICD-10-CM

## 2021-10-13 NOTE — Telephone Encounter (Signed)
Spoke with patient regarding benefits for recommended Mirena IUD Insertion. Patient acknowledges understanding of information presented. Patient is aware of cancellation policy. Patient scheduled appointment for 10/15/2021 at 03:30PM with Conley Simmonds, MD, FACOG. Encounter closed.

## 2021-10-14 NOTE — Progress Notes (Signed)
GYNECOLOGY  VISIT   HPI: 45 y.o.   Married  Caucasian  female   [phone] with Patient's last menstrual period was 10/12/2021 (exact date).   here for Mirena IUD insertion.   Spotting on Aygestin 5 mg po bid.  She has 21 pills left.   Traveling next weekend and worried about having a withdrawal bleed.   Having hot flashes.  FSH 7.2 and estradiol 50 on 08/05/21.   UPT:Neg  GYNECOLOGIC HISTORY: Patient's last menstrual period was 10/12/2021 (exact date). Contraception: Vasectomy/OCPs Menopausal hormone therapy:  none Last mammogram:  09-29-21  3D/Rt.Br.poss.distortion;Lt.Br.neg. Pt.to have Rt.Diag.w/US Last pap smear:  02-28-20 Neg:Neg HR HPV        OB History     Gravida  3   Para  3   Term  3   Preterm      AB      Living  4      SAB      IAB      Ectopic      Multiple  1   Live Births  4              Patient Active Problem List   Diagnosis Date Noted   Allergic rhinitis due to pollen 01/12/2018   Eustachian tube dysfunction 01/12/2018   TMJ (dislocation of temporomandibular joint) 01/12/2018   Obesity (BMI 30.0-34.9) 04/28/2017    Past Medical History:  Diagnosis Date   Allergy    Anxiety    History of chickenpox     Past Surgical History:  Procedure Laterality Date   CHOLECYSTECTOMY  2001   WISDOM TOOTH EXTRACTION  1999    Current Outpatient Medications  Medication Sig Dispense Refill   Ciclopirox 1 % shampoo APPLY TOPICALLY TO AFFECTED AREA AS NEEDED. 120 mL 1   clobetasol (TEMOVATE) 0.05 % external solution APPLY TOPICALLY TO AFFECTED AREA TWICE DAILY AS NEEDED. 50 mL 1   norethindrone (AYGESTIN) 5 MG tablet Take 1 tablet (5 mg total) by mouth 2 (two) times daily. 60 tablet 0   No current facility-administered medications for this visit.     ALLERGIES: Patient has no known allergies.  Family History  Problem Relation Age of Onset   Hyperlipidemia Mother    Hypertension Mother    Diabetes Father    Heart disease Father     Arthritis Father    Breast cancer Maternal Aunt    Diabetes Maternal Grandmother    Dementia Paternal Grandmother    Alcohol abuse Paternal Grandfather    Cancer Maternal Uncle        colon ca    Social History   Socioeconomic History   Marital status: Married    Spouse name: Alinda Money   Number of children: 4   Years of education: Not on file   Highest education level: Not on file  Occupational History   Occupation: Magazine features editor: [employer]  Tobacco Use   Smoking status: Never   Smokeless tobacco: Never  Vaping Use   Vaping Use: Never used  Substance and Sexual Activity   Alcohol use: Yes    Comment: 1 drink/month   Drug use: No   Sexual activity: Yes    Birth control/protection: Other-see comments    Comment: vasectomy in husband  Other Topics Concern   Not on file  Social History Narrative   Not on file   Social Determinants of Health   Financial Resource Strain: Not on file  Food Insecurity: Not  on file  Transportation Needs: Not on file  Physical Activity: Not on file  Stress: Not on file  Social Connections: Not on file  Intimate Partner Violence: Not on file    Review of Systems  All other systems reviewed and are negative.  PHYSICAL EXAMINATION:    BP (!) 142/84   Pulse (!) 112   Ht 5' 9.25" (1.759 m)   Wt 247 lb (112 kg)   LMP 10/12/2021 (Exact Date)   SpO2 96%   BMI 36.21 kg/m     General appearance: alert, cooperative and appears stated age   Pelvic: External genitalia:  no lesions              Urethra:  normal appearing urethra with no masses, tenderness or lesions              Bartholins and Skenes: normal                 Vagina: normal appearing vagina with normal color and discharge, no lesions              Cervix: no lesions                Bimanual Exam:  Uterus:  normal size, contour, position, consistency, mobility, non-tender              Adnexa: no mass, fullness, tenderness  Mirena IUD insertion - lot [id number], exp Nov  2024. Consent for procedure.  Sterile preop with betadine.  Local 1% lidocaine, 10 cc, lot [id number], exp 03/2023.  Tenaculum to anterior cervical lip.  Uterus sounded to 7.5 cm.  Mirena placed without difficulty.  Strings trimmed and shown to patient.  No complications.  Minimal EBL.   Repeat BM exam, no change.   Chaperone was present for exam:  Marchelle Folks, CMA  ASSESSMENT  Mirena IUD insertion.   PLAN  IUD card to patient.  Post procedure precautions given.  Heating pad and ibuprofen prn.  Continue Aygestin 5 mg po bid through end of next weekend, and then stop.  FU in 4 weeks for IUD check up.   An After Visit Summary was printed and given to the patient.

## 2021-10-15 ENCOUNTER — Ambulatory Visit (INDEPENDENT_AMBULATORY_CARE_PROVIDER_SITE_OTHER): Payer: No Typology Code available for payment source | Admitting: Obstetrics and Gynecology

## 2021-10-15 ENCOUNTER — Encounter: Payer: Self-pay | Admitting: Obstetrics and Gynecology

## 2021-10-15 ENCOUNTER — Other Ambulatory Visit: Payer: Self-pay

## 2021-10-15 ENCOUNTER — Other Ambulatory Visit (HOSPITAL_COMMUNITY): Payer: Self-pay

## 2021-10-15 VITALS — BP 142/84 | HR 112 | Ht 69.25 in | Wt 247.0 lb

## 2021-10-15 DIAGNOSIS — Z30430 Encounter for insertion of intrauterine contraceptive device: Secondary | ICD-10-CM | POA: Diagnosis not present

## 2021-10-15 DIAGNOSIS — Z01812 Encounter for preprocedural laboratory examination: Secondary | ICD-10-CM

## 2021-10-15 LAB — PREGNANCY, URINE: Preg Test, Ur: NEGATIVE

## 2021-10-15 MED ORDER — NORETHINDRONE ACETATE 5 MG PO TABS
5.0000 mg | ORAL_TABLET | Freq: Two times a day (BID) | ORAL | 0 refills | Status: DC
  Filled 2021-10-15: qty 10, 5d supply, fill #0

## 2021-10-15 NOTE — Patient Instructions (Signed)
Intrauterine Device Insertion, Care After This sheet gives you information about how to care for yourself after your procedure. Your health care provider may also give you more specific instructions. If you have problems or questions, contact your health care provider. What can I expect after the procedure? After the procedure, it is common to have: Cramps and pain in the abdomen. Bleeding. It may be light or heavy. This may last for a few days. Lower back pain. Dizziness. Headaches. Nausea. Follow these instructions at home:  Before resuming sexual activity, check to make sure that you can feel the IUD string or strings. You should be able to feel the end of the string below the opening of your cervix. If your IUD string is in place, you may resume sexual activity. If you had a hormonal IUD inserted more than 7 days after your most recent period started, you will need to use a backup method of birth control for 7 days after IUD insertion. Ask your health care provider whether this applies to you. Continue to check that the IUD is still in place by feeling for the strings after every menstrual period, or once a month. An IUD will not protect you from sexually transmitted infections (STIs). Use methods to prevent the exchange of body fluids between partners (barrier protection) every time you have sex. Barrier protection can be used during oral, vaginal, or anal sex. Commonly used barrier methods include: Female condom. Female condom. Dental dam. Take over-the-counter and prescription medicines only as told by your health care provider. Keep all follow-up visits as told by your health care provider. This is important. Contact a health care provider if: You feel light-headed or weak. You have any of the following problems with your IUD string or strings: The string bothers or hurts you or your sexual partner. You cannot feel the string. The string has gotten longer. You can feel the IUD in  your vagina. You think you may be pregnant, or you miss your menstrual period. You think you may have a sexually transmitted infection (STI). Get help right away if: You have flu-like symptoms, such as tiredness (fatigue) and muscle aches. You have a fever and chills. You have bleeding that is heavier or lasts longer than a normal menstrual cycle. You have abnormal or bad-smelling discharge from your vagina. You develop abdominal pain that is new, is getting worse, or is not in the same area of earlier cramping and pain. You have pain during sexual activity. Summary After the procedure, it is common to have cramps and pain in the abdomen. It is also common to have light bleeding or heavier bleeding that is like your menstrual period. Continue to check that the IUD is still in place by feeling for the strings after every menstrual period, or once a month. Keep all follow-up visits as told by your health care provider. This is important. Contact your health care provider if you have problems with your IUD strings, such as the string getting longer or bothering you or your sexual partner. This information is not intended to replace advice given to you by your health care provider. Make sure you discuss any questions you have with your health care provider. Document Revised: 11/27/2019 Document Reviewed: 11/27/2019 Elsevier Patient Education  2022 [employer]  

## 2021-10-19 ENCOUNTER — Other Ambulatory Visit (HOSPITAL_COMMUNITY): Payer: Self-pay

## 2021-10-21 ENCOUNTER — Telehealth: Payer: Self-pay

## 2021-10-21 NOTE — Telephone Encounter (Signed)
Patient had screening mammo at [facility] on 09/29/21 and follow up not yet scheduled. I do see that TBC has left two messages for patient to call.  I called and spoke with patient and offered to schedule this follow up imaging appt for her and call her back with date/time. She declined stating she is going out of town and will be gone for a week. I advised her that their schedules are running out about a month. She still declined me scheduling for her stating she needed to look at her schedule and find an afternoon when she can schedule it.

## 2021-10-22 ENCOUNTER — Other Ambulatory Visit (HOSPITAL_COMMUNITY): Payer: Self-pay

## 2021-10-22 MED FILL — Clobetasol Propionate Soln 0.05%: CUTANEOUS | 30 days supply | Qty: 50 | Fill #1 | Status: AC

## 2021-10-22 MED FILL — Ciclopirox Shampoo 1%: CUTANEOUS | 30 days supply | Qty: 120 | Fill #1 | Status: AC

## 2021-11-06 ENCOUNTER — Other Ambulatory Visit: Payer: Self-pay

## 2021-11-06 ENCOUNTER — Ambulatory Visit
Admission: RE | Admit: 2021-11-06 | Discharge: 2021-11-06 | Disposition: A | Discharge status: Home / Self Care | Payer: No Typology Code available for payment source | Source: Ambulatory Visit | Attending: Obstetrics and Gynecology | Admitting: Obstetrics and Gynecology

## 2021-11-06 ENCOUNTER — Ambulatory Visit: Payer: No Typology Code available for payment source

## 2021-11-06 DIAGNOSIS — R928 Other abnormal and inconclusive findings on diagnostic imaging of breast: Secondary | ICD-10-CM

## 2021-11-18 ENCOUNTER — Ambulatory Visit: Payer: No Typology Code available for payment source | Admitting: Obstetrics and Gynecology

## 2021-12-03 NOTE — Progress Notes (Signed)
GYNECOLOGY  VISIT   HPI: 45 y.o.   Married  Caucasian  female   [phone] with No LMP recorded. (Menstrual status: IUD).   here for   mirena iud follow up.  Bleeding is almost nonexistent.  Spotting only.  Having her second menstrual cycle since her IUD was placed.   Noticing some bloating.  Wants to loose weight.  Has been able to successfully loose weight in the past, and then regains it.  May ask her PCP for phentermine.  Has considered medical weight management program, which is not a good option for her right now.  She can considered lap band surgery.   GYNECOLOGIC HISTORY: No LMP recorded. (Menstrual status: IUD). Contraception:  mirena iud inserted 10-15-21 Menopausal hormone therapy:  none Last mammogram:  bilateral 09-29-21 & rt breast u/s 11-06-21 Last pap smear:   02-28-20 neg HPV HR neg        OB History     Gravida  3   Para  3   Term  3   Preterm      AB      Living  4      SAB      IAB      Ectopic      Multiple  1   Live Births  4              Patient Active Problem List   Diagnosis Date Noted   Allergic rhinitis due to pollen 01/12/2018   Eustachian tube dysfunction 01/12/2018   TMJ (dislocation of temporomandibular joint) 01/12/2018   Obesity (BMI 30.0-34.9) 04/28/2017    Past Medical History:  Diagnosis Date   Allergy    Anxiety    History of chickenpox     Past Surgical History:  Procedure Laterality Date   CHOLECYSTECTOMY  2001   INTRAUTERINE DEVICE (IUD) INSERTION     mirena inserted 10-15-21   WISDOM TOOTH EXTRACTION  1999    Current Outpatient Medications  Medication Sig Dispense Refill   Ciclopirox 1 % shampoo APPLY TOPICALLY TO AFFECTED AREA AS NEEDED. 120 mL 1   clobetasol (TEMOVATE) 0.05 % external solution APPLY TOPICALLY TO AFFECTED AREA TWICE DAILY AS NEEDED. 50 mL 1   levonorgestrel (MIRENA) 20 MCG/DAY IUD 1 each by Intrauterine route once.     No current facility-administered medications for this visit.      ALLERGIES: Patient has no known allergies.  Family History  Problem Relation Age of Onset   Hyperlipidemia Mother    Hypertension Mother    Diabetes Father    Heart disease Father    Arthritis Father    Breast cancer Maternal Aunt    Diabetes Maternal Grandmother    Dementia Paternal Grandmother    Alcohol abuse Paternal Grandfather    Cancer Maternal Uncle        colon ca    Social History   Socioeconomic History   Marital status: Married    Spouse name: Alinda Money   Number of children: 4   Years of education: Not on file   Highest education level: Not on file  Occupational History   Occupation: Magazine features editor: [employer]  Tobacco Use   Smoking status: Never   Smokeless tobacco: Never  Vaping Use   Vaping Use: Never used  Substance and Sexual Activity   Alcohol use: Yes    Comment: 1 drink/month   Drug use: No   Sexual activity: Yes    Birth control/protection:  I.U.D.    Comment: husband vasectomy  Other Topics Concern   Not on file  Social History Narrative   Not on file   Social Determinants of Health   Financial Resource Strain: Not on file  Food Insecurity: Not on file  Transportation Needs: Not on file  Physical Activity: Not on file  Stress: Not on file  Social Connections: Not on file  Intimate Partner Violence: Not on file    Review of Systems  Constitutional: Negative.   HENT: Negative.    Eyes: Negative.   Respiratory: Negative.    Cardiovascular: Negative.   Gastrointestinal:        Bloating  Endocrine: Negative.   Genitourinary: Negative.   Musculoskeletal: Negative.   Skin: Negative.   Allergic/Immunologic: Negative.   Neurological: Negative.   Hematological: Negative.   Psychiatric/Behavioral: Negative.     PHYSICAL EXAMINATION:    BP 120/80    Pulse 96    Resp 16    Wt 274 lb (124.3 kg)    BMI 40.17 kg/m     General appearance: alert, cooperative and appears stated age   Pelvic: External genitalia:  no lesions               Urethra:  normal appearing urethra with no masses, tenderness or lesions              Bartholins and Skenes: normal                 Vagina: normal appearing vagina with normal color and discharge, no lesions              Cervix: no lesions.  IUD strings noted.                 Bimanual Exam:  Uterus:  normal size, contour, position, consistency, mobility, non-tender              Adnexa: no mass, fullness, tenderness          Chaperone was present for exam:  Joy, CMA.  ASSESSMENT  Mirena IUD check up.  Doing well.  BMI 40.17.  PLAN  IUD effective for 8 years.  We discussed weight loss through healthy eating.  Written information given.  We talked about making slow changes in her dietary choices to reduce sugar and fat intake.  FU for annual exam and prn.    An After Visit Summary was printed and given to the patient.  20 min  total time was spent for this patient encounter, including preparation, face-to-face counseling with the patient, coordination of care, and documentation of the encounter.

## 2021-12-15 ENCOUNTER — Other Ambulatory Visit: Payer: Self-pay

## 2021-12-15 ENCOUNTER — Ambulatory Visit: Payer: No Typology Code available for payment source | Admitting: Obstetrics and Gynecology

## 2021-12-15 ENCOUNTER — Encounter: Payer: Self-pay | Admitting: Obstetrics and Gynecology

## 2021-12-15 VITALS — BP 120/80 | HR 96 | Resp 16 | Wt 274.0 lb

## 2021-12-15 DIAGNOSIS — Z713 Dietary counseling and surveillance: Secondary | ICD-10-CM | POA: Diagnosis not present

## 2021-12-15 DIAGNOSIS — Z30431 Encounter for routine checking of intrauterine contraceptive device: Secondary | ICD-10-CM | POA: Diagnosis not present

## 2021-12-15 NOTE — Patient Instructions (Signed)
Healthy Eating °Following a healthy eating pattern may help you to achieve and maintain a healthy body weight, reduce the risk of chronic disease, and live a long and productive life. It is important to follow a healthy eating pattern at an appropriate calorie level for your body. Your nutritional needs should be met primarily through food by choosing a variety of nutrient-rich foods. °What are tips for following this plan? °Reading food labels °Read labels and choose the following: °Reduced or low sodium. °Juices with 100% fruit juice. °Foods with low saturated fats and high polyunsaturated and monounsaturated fats. °Foods with whole grains, such as whole wheat, cracked wheat, brown rice, and wild rice. °Whole grains that are fortified with folic acid. This is recommended for women who are pregnant or who want to become pregnant. °Read labels and avoid the following: °Foods with a lot of added sugars. These include foods that contain brown sugar, corn sweetener, corn syrup, dextrose, fructose, glucose, high-fructose corn syrup, honey, invert sugar, lactose, malt syrup, maltose, molasses, raw sugar, sucrose, trehalose, or turbinado sugar. °Do not eat more than the following amounts of added sugar per day: °6 teaspoons (25 g) for women. °9 teaspoons (38 g) for men. °Foods that contain processed or refined starches and grains. °Refined grain products, such as white flour, degermed cornmeal, white bread, and white rice. °Shopping °Choose nutrient-rich snacks, such as vegetables, whole fruits, and nuts. Avoid high-calorie and high-sugar snacks, such as potato chips, fruit snacks, and candy. °Use oil-based dressings and spreads on foods instead of solid fats such as butter, stick margarine, or cream cheese. °Limit pre-made sauces, mixes, and "instant" products such as flavored rice, instant noodles, and ready-made pasta. °Try more plant-protein sources, such as tofu, tempeh, black beans, edamame, lentils, nuts, and  seeds. °Explore eating plans such as the Mediterranean diet or vegetarian diet. °Cooking °Use oil to sauté or stir-fry foods instead of solid fats such as butter, stick margarine, or lard. °Try baking, boiling, grilling, or broiling instead of frying. °Remove the fatty part of meats before cooking. °Steam vegetables in water or broth. °Meal planning ° °At meals, imagine dividing your plate into fourths: °One-half of your plate is fruits and vegetables. °One-fourth of your plate is whole grains. °One-fourth of your plate is protein, especially lean meats, poultry, eggs, tofu, beans, or nuts. °Include low-fat dairy as part of your daily diet. °Lifestyle °Choose healthy options in all settings, including home, work, school, restaurants, or stores. °Prepare your food safely: °Wash your hands after handling raw meats. °Keep food preparation surfaces clean by regularly washing with hot, soapy water. °Keep raw meats separate from ready-to-eat foods, such as fruits and vegetables. °Cook seafood, meat, poultry, and eggs to the recommended internal temperature. °Store foods at safe temperatures. In general: °Keep cold foods at 40°F (4.4°C) or below. °Keep hot foods at 140°F (60°C) or above. °Keep your freezer at 0°F (-17.8°C) or below. °Foods are no longer safe to eat when they have been between the temperatures of 40°-140°F (4.4-60°C) for more than 2 hours. °What foods should I eat? °Fruits °Aim to eat 2 cup-equivalents of fresh, canned (in natural juice), or frozen fruits each day. Examples of 1 cup-equivalent of fruit include 1 small apple, 8 large strawberries, 1 cup canned fruit, ½ cup dried fruit, or 1 cup 100% juice. °Vegetables °Aim to eat 2½-3 cup-equivalents of fresh and frozen vegetables each day, including different varieties and colors. Examples of 1 cup-equivalent of vegetables include 2 medium carrots, 2 cups raw,   leafy greens, 1 cup chopped vegetable (raw or cooked), or 1 medium baked potato. Grains Aim to  eat 6 ounce-equivalents of whole grains each day. Examples of 1 ounce-equivalent of grains include 1 slice of bread, 1 cup ready-to-eat cereal, 3 cups popcorn, or  cup cooked rice, pasta, or cereal. Meats and other proteins Aim to eat 5-6 ounce-equivalents of protein each day. Examples of 1 ounce-equivalent of protein include 1 egg, 1/2 cup nuts or seeds, or 1 tablespoon (16 g) peanut butter. A cut of meat or fish that is the size of a deck of cards is about 3-4 ounce-equivalents. Of the protein you eat each week, try to have at least 8 ounces come from seafood. This includes salmon, trout, herring, and anchovies. Dairy Aim to eat 3 cup-equivalents of fat-free or low-fat dairy each day. Examples of 1 cup-equivalent of dairy include 1 cup (240 mL) milk, 8 ounces (250 g) yogurt, 1 ounces (44 g) natural cheese, or 1 cup (240 mL) fortified soy milk. Fats and oils Aim for about 5 teaspoons (21 g) per day. Choose monounsaturated fats, such as canola and olive oils, avocados, peanut butter, and most nuts, or polyunsaturated fats, such as sunflower, corn, and soybean oils, walnuts, pine nuts, sesame seeds, sunflower seeds, and flaxseed. Beverages Aim for six 8-oz glasses of water per day. Limit coffee to three to five 8-oz cups per day. Limit caffeinated beverages that have added calories, such as soda and energy drinks. Limit alcohol intake to no more than 1 drink a day for nonpregnant women and 2 drinks a day for men. One drink equals 12 oz of beer (355 mL), 5 oz of wine (148 mL), or 1 oz of hard liquor (44 mL). Seasoning and other foods Avoid adding excess amounts of salt to your foods. Try flavoring foods with herbs and spices instead of salt. Avoid adding sugar to foods. Try using oil-based dressings, sauces, and spreads instead of solid fats. This information is based on general [place] nutrition guidelines. For more information, visit BuildDNA.es. Exact amounts may vary based on your nutrition  needs. Summary A healthy eating plan may help you to maintain a healthy weight, reduce the risk of chronic diseases, and stay active throughout your life. Plan your meals. Make sure you eat the right portions of a variety of nutrient-rich foods. Try baking, boiling, grilling, or broiling instead of frying. Choose healthy options in all settings, including home, work, school, restaurants, or stores. This information is not intended to replace advice given to you by your health care provider. Make sure you discuss any questions you have with your health care provider. Document Revised: 08/04/2021 Document Reviewed: 08/04/2021 Elsevier Patient Education  [employer] Calories are units of energy. Your body needs a certain number of calories from food to keep going throughout the day. When you eat or drink more calories than your body needs, your body stores the extra calories mostly as fat. When you eat or drink fewer calories than your body needs, your body burns fat to get the energy it needs. Calorie counting means keeping track of how many calories you eat and drink each day. Calorie counting can be helpful if you need to lose weight. If you eat fewer calories than your body needs, you should lose weight. Ask your health care provider what a healthy weight is for you. For calorie counting to work, you will need to eat the right number of calories each day  to lose a healthy amount of weight per week. A dietitian can help you figure out how many calories you need in a day and will suggest ways to reach your calorie goal. A healthy amount of weight to lose each week is usually 1-2 lb (0.5-0.9 kg). This usually means that your daily calorie intake should be reduced by 500-750 calories. Eating 1,200-1,500 calories a day can help most women lose weight. Eating 1,500-1,800 calories a day can help most men lose weight. What do I need to know about calorie  counting? Work with your health care provider or dietitian to determine how many calories you should get each day. To meet your daily calorie goal, you will need to: Find out how many calories are in each food that you would like to eat. Try to do this before you eat. Decide how much of the food you plan to eat. Keep a food log. Do this by writing down what you ate and how many calories it had. To successfully lose weight, it is important to balance calorie counting with a healthy lifestyle that includes regular activity. Where do I find calorie information? The number of calories in a food can be found on a Nutrition Facts label. If a food does not have a Nutrition Facts label, try to look up the calories online or ask your dietitian for help. Remember that calories are listed per serving. If you choose to have more than one serving of a food, you will have to multiply the calories per serving by the number of servings you plan to eat. For example, the label on a package of bread might say that a serving size is 1 slice and that there are 90 calories in a serving. If you eat 1 slice, you will have eaten 90 calories. If you eat 2 slices, you will have eaten 180 calories. How do I keep a food log? After each time that you eat, record the following in your food log as soon as possible: What you ate. Be sure to include toppings, sauces, and other extras on the food. How much you ate. This can be measured in cups, ounces, or number of items. How many calories were in each food and drink. The total number of calories in the food you ate. Keep your food log near you, such as in a pocket-sized notebook or on an app or website on your mobile phone. Some programs will calculate calories for you and show you how many calories you have left to meet your daily goal. What are some portion-control tips? Know how many calories are in a serving. This will help you know how many servings you can have of a certain  food. Use a measuring cup to measure serving sizes. You could also try weighing out portions on a kitchen scale. With time, you will be able to estimate serving sizes for some foods. Take time to put servings of different foods on your favorite plates or in your favorite bowls and cups so you know what a serving looks like. Try not to eat straight from a food's packaging, such as from a bag or box. Eating straight from the package makes it hard to see how much you are eating and can lead to overeating. Put the amount you would like to eat in a cup or on a plate to make sure you are eating the right portion. Use smaller plates, glasses, and bowls for smaller portions and to prevent overeating. Try not  to multitask. For example, avoid watching TV or using your computer while eating. If it is time to eat, sit down at a table and enjoy your food. This will help you recognize when you are full. It will also help you be more mindful of what and how much you are eating. What are tips for following this plan? Reading food labels Check the calorie count compared with the serving size. The serving size may be smaller than what you are used to eating. Check the source of the calories. Try to choose foods that are high in protein, fiber, and vitamins, and low in saturated fat, trans fat, and sodium. Shopping Read nutrition labels while you shop. This will help you make healthy decisions about which foods to buy. Pay attention to nutrition labels for low-fat or fat-free foods. These foods sometimes have the same number of calories or more calories than the full-fat versions. They also often have added sugar, starch, or salt to make up for flavor that was removed with the fat. Make a grocery list of lower-calorie foods and stick to it. Cooking Try to cook your favorite foods in a healthier way. For example, try baking instead of frying. Use low-fat dairy products. Meal planning Use more fruits and vegetables.  One-half of your plate should be fruits and vegetables. Include lean proteins, such as chicken, [place], and fish. Lifestyle Each week, aim to do one of the following: 150 minutes of moderate exercise, such as walking. 75 minutes of vigorous exercise, such as running. General information Know how many calories are in the foods you eat most often. This will help you calculate calorie counts faster. Find a way of tracking calories that works for you. Get creative. Try different apps or programs if writing down calories does not work for you. What foods should I eat?  Eat nutritious foods. It is better to have a nutritious, high-calorie food, such as an avocado, than a food with few nutrients, such as a bag of potato chips. Use your calories on foods and drinks that will fill you up and will not leave you hungry soon after eating. Examples of foods that fill you up are nuts and nut butters, vegetables, lean proteins, and high-fiber foods such as whole grains. High-fiber foods are foods with more than 5 g of fiber per serving. Pay attention to calories in drinks. Low-calorie drinks include water and unsweetened drinks. The items listed above may not be a complete list of foods and beverages you can eat. Contact a dietitian for more information. What foods should I limit? Limit foods or drinks that are not good sources of vitamins, minerals, or protein or that are high in unhealthy fats. These include: Candy. Other sweets. Sodas, specialty coffee drinks, alcohol, and juice. The items listed above may not be a complete list of foods and beverages you should avoid. Contact a dietitian for more information. How do I count calories when eating out? Pay attention to portions. Often, portions are much larger when eating out. Try these tips to keep portions smaller: Consider sharing a meal instead of getting your own. If you get your own meal, eat only half of it. Before you start eating, ask for a  container and put half of your meal into it. When available, consider ordering smaller portions from the menu instead of full portions. Pay attention to your food and drink choices. Knowing the way food is cooked and what is included with the meal can help you eat  fewer calories. If calories are listed on the menu, choose the lower-calorie options. Choose dishes that include vegetables, fruits, whole grains, low-fat dairy products, and lean proteins. Choose items that are boiled, broiled, grilled, or steamed. Avoid items that are buttered, battered, fried, or served with cream sauce. Items labeled as crispy are usually fried, unless stated otherwise. Choose water, low-fat milk, unsweetened iced tea, or other drinks without added sugar. If you want an alcoholic beverage, choose a lower-calorie option, such as a glass of wine or light beer. Ask for dressings, sauces, and syrups on the side. These are usually high in calories, so you should limit the amount you eat. If you want a salad, choose a garden salad and ask for grilled meats. Avoid extra toppings such as bacon, cheese, or fried items. Ask for the dressing on the side, or ask for olive oil and vinegar or lemon to use as dressing. Estimate how many servings of a food you are given. Knowing serving sizes will help you be aware of how much food you are eating at restaurants. Where to find more information Centers for Disease Control and Prevention: http://www.wolf.info/ U.[place]. Department of Agriculture: http://www.wilson-mendoza.org/ Summary Calorie counting means keeping track of how many calories you eat and drink each day. If you eat fewer calories than your body needs, you should lose weight. A healthy amount of weight to lose per week is usually 1-2 lb (0.5-0.9 kg). This usually means reducing your daily calorie intake by 500-750 calories. The number of calories in a food can be found on a Nutrition Facts label. If a food does not have a Nutrition Facts label, try to look  up the calories online or ask your dietitian for help. Use smaller plates, glasses, and bowls for smaller portions and to prevent overeating. Use your calories on foods and drinks that will fill you up and not leave you hungry shortly after a meal. This information is not intended to replace advice given to you by your health care provider. Make sure you discuss any questions you have with your health care provider. Document Revised: 01/17/2020 Document Reviewed: 01/17/2020 Elsevier Patient Education  2022 [employer].

## 2022-03-29 ENCOUNTER — Other Ambulatory Visit (HOSPITAL_COMMUNITY): Payer: Self-pay

## 2022-03-29 ENCOUNTER — Telehealth: Payer: Self-pay | Admitting: *Deleted

## 2022-03-29 DIAGNOSIS — N760 Acute vaginitis: Secondary | ICD-10-CM | POA: Insufficient documentation

## 2022-03-29 MED ORDER — FLUCONAZOLE 150 MG PO TABS
150.0000 mg | ORAL_TABLET | Freq: Every day | ORAL | 0 refills | Status: DC
  Filled 2022-03-29: qty 2, 4d supply, fill #0

## 2022-03-29 NOTE — Telephone Encounter (Signed)
Patient called and left message in triage voicemail c/o yeast infection lots of itching, has tired OTC and no relief. Patient called requesting diflucan tablet, I advised patient office visit is recommended for treatment. Patient said she can't schedule right now and will call back if needed.  ? ?Just FYI.  ?

## 2022-04-26 ENCOUNTER — Other Ambulatory Visit (HOSPITAL_COMMUNITY): Payer: Self-pay

## 2022-04-26 ENCOUNTER — Encounter: Payer: Self-pay | Admitting: Obstetrics and Gynecology

## 2022-04-26 ENCOUNTER — Ambulatory Visit (INDEPENDENT_AMBULATORY_CARE_PROVIDER_SITE_OTHER): Payer: No Typology Code available for payment source | Admitting: Obstetrics and Gynecology

## 2022-04-26 VITALS — BP 134/68 | HR 81 | Ht 69.5 in | Wt 268.0 lb

## 2022-04-26 DIAGNOSIS — B372 Candidiasis of skin and nail: Secondary | ICD-10-CM

## 2022-04-26 DIAGNOSIS — Z01419 Encounter for gynecological examination (general) (routine) without abnormal findings: Secondary | ICD-10-CM | POA: Diagnosis not present

## 2022-04-26 MED ORDER — NYSTATIN 100000 UNIT/GM EX POWD
1.0000 "application " | Freq: Three times a day (TID) | CUTANEOUS | 4 refills | Status: DC
  Filled 2022-04-26: qty 30, 10d supply, fill #0
  Filled 2022-06-23: qty 30, 10d supply, fill #1
  Filled 2022-08-03 – 2022-08-24 (×2): qty 30, 10d supply, fill #2
  Filled 2022-10-19: qty 30, 10d supply, fill #3
  Filled 2023-02-10: qty 30, 10d supply, fill #4

## 2022-04-26 NOTE — Progress Notes (Signed)
46 y.o. G63P3004 Married Caucasian female here for annual exam.   ? ?Spotting with her Mirena IUD every now and then.  ? ?Dealing with some weight gain. ?10 pounds up and down.  ?She knows the benefits of weight loss, but has difficulty finding time for exercise. ?Busy family life. ? ?PCP:   Alyssa Allwardt, PA-C ? ?No LMP recorded. (Menstrual status: IUD).     ?Period Cycle (Days):  (no cycle with Mirena IUD) ?    ?Sexually active: Yes.    ?The current method of family planning is Vasectomy/IUD--Mirena 10/15/21.    ?Exercising: No.  The patient does not participate in regular exercise at present. ?Smoker:  no ? ?Health Maintenance: ?Pap:  02-28-20 Neg, neg HR HPV ?History of abnormal Pap:  no ?MMG:  09-29-21 Rt.Br.poss.distortion;Lt.Br.Neg. Diag.Rt.Br.Neg/BiRads1/screening 1year ?Colonoscopy:  NEVER ?BMD:   n/a  Result  n/a ?TDaP:  12-02-14 ?Gardasil:   n/a ?HIV: neg in the past ?Hep C: neg in the past ?Screening Labs:  PCP ? ? reports that she has never smoked. She has never used smokeless tobacco. She reports current alcohol use. She reports that she does not use drugs. ? ?Past Medical History:  ?Diagnosis Date  ? Allergy   ? Anxiety   ? History of chickenpox   ? ? ?Past Surgical History:  ?Procedure Laterality Date  ? CHOLECYSTECTOMY  2001  ? INTRAUTERINE DEVICE (IUD) INSERTION    ? mirena inserted 10-15-21  ? WISDOM TOOTH EXTRACTION  1999  ? ? ?Current Outpatient Medications  ?Medication Sig Dispense Refill  ? levonorgestrel (MIRENA) 20 MCG/DAY IUD 1 each by Intrauterine route once.    ? ?No current facility-administered medications for this visit.  ? ? ?Family History  ?Problem Relation Age of Onset  ? Hyperlipidemia Mother   ? Hypertension Mother   ? Diabetes Father   ? Heart disease Father   ? Arthritis Father   ? Breast cancer Maternal Aunt   ? Diabetes Maternal Grandmother   ? Dementia Paternal Grandmother   ? Alcohol abuse Paternal Grandfather   ? Cancer Maternal Uncle   ?     colon ca  ? ? ?Review of  Systems  ?All other systems reviewed and are negative. ? ?Exam:   ?BP 134/68   Pulse 81   Ht 5' 9.5" (1.765 m)   Wt 268 lb (121.6 kg)   SpO2 97%   BMI 39.01 kg/m?     ?General appearance: alert, cooperative and appears stated age ?Head: normocephalic, without obvious abnormality, atraumatic ?Neck: no adenopathy, supple, symmetrical, trachea midline and thyroid normal to inspection and palpation ?Lungs: clear to auscultation bilaterally ?Breasts: normal appearance, no masses or tenderness, No nipple retraction or dimpling, No nipple discharge or bleeding, No axillary adenopathy ?Heart: regular rate and rhythm ?Abdomen: soft, non-tender; no masses, no organomegaly ?Extremities: extremities normal, atraumatic, no cyanosis or edema ?Skin: skin color, texture, turgor normal. Cracking and erythema of skin under panus and in flexural fold of thigh against body.   ?Lymph nodes: cervical, supraclavicular, and axillary nodes normal. ?Neurologic: grossly normal ? ?Pelvic: External genitalia:  no lesions ?             No abnormal inguinal nodes palpated. ?             Urethra:  normal appearing urethra with no masses, tenderness or lesions ?             Bartholins and Skenes: normal    ?  Vagina: normal appearing vagina with normal color and discharge, no lesions ?             Cervix: no lesions.  IUD strings noted.  ?             Pap taken: no ?Bimanual Exam:  Uterus:  normal size, contour, position, consistency, mobility, non-tender ?             Adnexa: no mass, fullness, tenderness ?             Rectal exam: yes.  Confirms. ?             Anus:  normal sphincter tone, no lesions ? ?Chaperone was present for exam:  Marchelle Folks, CMA ? ?Assessment:   ?Well woman visit with gynecologic exam. ?Mirena IUD.  ?Candida of flexural skin.  ?Hx GDM.  ? ?Plan: ?Mammogram screening discussed. ?Self breast awareness reviewed. ?Pap and HR HPV as above. ?Guidelines for Calcium, Vitamin D, regular exercise program including  cardiovascular and weight bearing exercise. ?We discussed colon cancer screening.  ?Follow up annually and prn.  ? ?After visit summary provided.  ? ? ? ?

## 2022-04-26 NOTE — Patient Instructions (Signed)
EXERCISE AND DIET:  We recommended that you start or continue a regular exercise program for good health. Regular exercise means any activity that makes your heart beat faster and makes you sweat.  We recommend exercising at least 30 minutes per day at least 3 days a week, preferably 4 or 5.  We also recommend a diet low in fat and sugar.  Inactivity, poor dietary choices and obesity can cause diabetes, heart attack, stroke, and kidney damage, among others.   ? ?ALCOHOL AND SMOKING:  Women should limit their alcohol intake to no more than 7 drinks/beers/glasses of wine (combined, not each!) per week. Moderation of alcohol intake to this level decreases your risk of breast cancer and liver damage. And of course, no recreational drugs are part of a healthy lifestyle.  And absolutely no smoking or even second hand smoke. Most people know smoking can cause heart and lung diseases, but did you know it also contributes to weakening of your bones? Aging of your skin?  Yellowing of your teeth and nails? ? ?CALCIUM AND VITAMIN D:  Adequate intake of calcium and Vitamin D are recommended.  The recommendations for exact amounts of these supplements seem to change often, but generally speaking 600 mg of calcium (either carbonate or citrate) and 800 units of Vitamin D per day seems prudent. Certain women may benefit from higher intake of Vitamin D.  If you are among these women, your doctor will have told you during your visit.   ? ?PAP SMEARS:  Pap smears, to check for cervical cancer or precancers,  have traditionally been done yearly, although recent scientific advances have shown that most women can have pap smears less often.  However, every woman still should have a physical exam from her gynecologist every year. It will include a breast check, inspection of the vulva and vagina to check for abnormal growths or skin changes, a visual exam of the cervix, and then an exam to evaluate the size and shape of the uterus and  ovaries.  And after 46 years of age, a rectal exam is indicated to check for rectal cancers. We will also provide age appropriate advice regarding health maintenance, like when you should have certain vaccines, screening for sexually transmitted diseases, bone density testing, colonoscopy, mammograms, etc.  ? ?MAMMOGRAMS:  All women over 40 years old should have a yearly mammogram. Many facilities now offer a "3D" mammogram, which may cost around $50 extra out of pocket. If possible,  we recommend you accept the option to have the 3D mammogram performed.  It both reduces the number of women who will be called back for extra views which then turn out to be normal, and it is better than the routine mammogram at detecting truly abnormal areas.   ? ?COLONOSCOPY:  Colonoscopy to screen for colon cancer is recommended for all women at age 50.  We know, you hate the idea of the prep.  We agree, BUT, having colon cancer and not knowing it is worse!!  Colon cancer so often starts as a polyp that can be seen and removed at colonscopy, which can quite literally save your life!  And if your first colonoscopy is normal and you have no family history of colon cancer, most women don't have to have it again for 10 years.  Once every ten years, you can do something that may end up saving your life, right?  We will be happy to help you get it scheduled when you are ready.    Be sure to check your insurance coverage so you understand how much it will cost.  It may be covered as a preventative service at no cost, but you should check your particular policy.   ? ?Calcium Content in Foods ?Calcium is the most abundant mineral in the body. Most of the body's calcium supply is stored in bones and teeth. Calcium helps many parts of the body function normally, including: ?Blood and blood vessels. ?Nerves. ?Hormones. ?Muscles. ?Bones and teeth. ?When your calcium stores are low, you may be at risk for low bone mass, bone loss, and broken bones  (fractures). When you get enough calcium, it helps to support strong bones and teeth throughout your life. ?Calcium is especially important for: ?Children during growth spurts. ?Girls during adolescence. ?Women who are pregnant or breastfeeding. ?Women after their menstrual cycle stops (postmenopause). ?Women whose menstrual cycle has stopped due to anorexia nervosa or regular intense exercise. ?People who cannot eat or digest dairy products. ?Vegans. ?Recommended daily amounts of calcium: ?Women (ages 19 to 50): 1,000 mg per day. ?Women (ages 51 and older): 1,200 mg per day. ?Men (ages 19 to 70): 1,000 mg per day. ?Men (ages 71 and older): 1,200 mg per day. ?Women (ages 9 to 18): 1,300 mg per day. ?Men (ages 9 to 18): 1,300 mg per day. ?General information ?Eat foods that are high in calcium. Try to get most of your calcium from food. ?Some people may benefit from taking calcium supplements. Check with your health care provider or diet and nutrition specialist (dietitian) before starting any calcium supplements. Calcium supplements may interact with certain medicines. Too much calcium may cause other health problems, such as constipation and kidney stones. ?For the body to absorb calcium, it needs vitamin D. Sources of vitamin D include: ?Skin exposure to direct sunlight. ?Foods, such as egg yolks, liver, mushrooms, saltwater fish, and fortified milk. ?Vitamin D supplements. Check with your health care provider or dietitian before starting any vitamin D supplements. ?What foods are high in calcium? ? ?Foods that are high in calcium contain more than 100 milligrams per serving. ?Fruits ?Fortified orange juice or other fruit juice, 300 mg per 8 oz serving. ?Vegetables ?Collard greens, 360 mg per 8 oz serving. ?Kale, 100 mg per 8 oz serving. ?Bok choy, 160 mg per 8 oz serving. ?Grains ?Fortified ready-to-eat cereals, 100 to 1,000 mg per 8 oz serving. ?Fortified frozen waffles, 200 mg in 2 waffles. ?Oatmeal, 140 mg in  1 cup. ?Meats and other proteins ?Sardines, canned with bones, 325 mg per 3 oz serving. ?Salmon, canned with bones, 180 mg per 3 oz serving. ?Canned shrimp, 125 mg per 3 oz serving. ?Baked beans, 160 mg per 4 oz serving. ?Tofu, firm, made with calcium sulfate, 253 mg per 4 oz serving. ?Dairy ?Yogurt, plain, low-fat, 310 mg per 6 oz serving. ?Nonfat milk, 300 mg per 8 oz serving. ?American cheese, 195 mg per 1 oz serving. ?Cheddar cheese, 205 mg per 1 oz serving. ?Cottage cheese 2%, 105 mg per 4 oz serving. ?Fortified soy, rice, or almond milk, 300 mg per 8 oz serving. ?Mozzarella, part skim, 210 mg per 1 oz serving. ?The items listed above may not be a complete list of foods high in calcium. Actual amounts of calcium may be different depending on processing. Contact a dietitian for more information. ?What foods are lower in calcium? ?Foods that are lower in calcium contain 50 mg or less per serving. ?Fruits ?Apple, about 6 mg. ?Banana, about 12 mg. ?  Vegetables ?Lettuce, 19 mg per 2 oz serving. ?Tomato, about 11 mg. ?Grains ?Rice, 4 mg per 6 oz serving. ?Boiled potatoes, 14 mg per 8 oz serving. ?White bread, 6 mg per slice. ?Meats and other proteins ?Egg, 27 mg per 2 oz serving. ?Red meat, 7 mg per 4 oz serving. ?Chicken, 17 mg per 4 oz serving. ?Fish, cod, or trout, 20 mg per 4 oz serving. ?Dairy ?Cream cheese, regular, 14 mg per 1 Tbsp serving. ?Brie cheese, 50 mg per 1 oz serving. ?Parmesan cheese, 70 mg per 1 Tbsp serving. ?The items listed above may not be a complete list of foods lower in calcium. Actual amounts of calcium may be different depending on processing. Contact a dietitian for more information. ?Summary ?Calcium is an important mineral in the body because it affects many functions. Getting enough calcium helps support strong bones and teeth throughout your life. ?Try to get most of your calcium from food. ?Calcium supplements may interact with certain medicines. Check with your health care provider  or dietitian before starting any calcium supplements. ?This information is not intended to replace advice given to you by your health care provider. Make sure you discuss any questions you have with your h

## 2022-05-11 ENCOUNTER — Other Ambulatory Visit: Payer: Self-pay | Admitting: Physician Assistant

## 2022-05-11 ENCOUNTER — Telehealth: Payer: Self-pay

## 2022-05-11 MED ORDER — CICLOPIROX 1 % EX SHAM
1.0000 "application " | MEDICATED_SHAMPOO | Freq: Every day | CUTANEOUS | 0 refills | Status: DC
  Filled 2022-05-11: qty 120, 25d supply, fill #0

## 2022-05-11 NOTE — Telephone Encounter (Signed)
Please see Rx request and advise

## 2022-05-11 NOTE — Telephone Encounter (Signed)
..   Encourage patient to contact the pharmacy for refills or they can request refills through [facility]  LAST APPOINTMENT DATE: 03/19/21  NEXT APPOINTMENT DATE: 05/26/22  MEDICATION: ciclopirox 1% shampoo  Is the patient out of medication?   PHARMACY: [facility] outpatient pharm  Let patient know to contact pharmacy at the end of the day to make sure medication is ready.  Please notify patient to allow 48-72 hours to process  CLINICAL FILLS OUT ALL BELOW:   LAST REFILL:  QTY:  REFILL DATE:    OTHER COMMENTS: Patient states she has tried several OTC meds that are not working.  Does understand she needs to get into the office.  We have scheduled for 6/7.  Patient has to work around her work scheduled w/ [employer].  Would like to know if Allwardt can please make exception this time to send script into the pharmacy?   Okay for refill?  Please advise

## 2022-05-12 ENCOUNTER — Other Ambulatory Visit (HOSPITAL_COMMUNITY): Payer: Self-pay

## 2022-05-26 ENCOUNTER — Encounter: Payer: Self-pay | Admitting: Physician Assistant

## 2022-05-26 ENCOUNTER — Other Ambulatory Visit (HOSPITAL_COMMUNITY): Payer: Self-pay

## 2022-05-26 ENCOUNTER — Ambulatory Visit (INDEPENDENT_AMBULATORY_CARE_PROVIDER_SITE_OTHER): Payer: No Typology Code available for payment source | Admitting: Physician Assistant

## 2022-05-26 VITALS — BP 134/88 | HR 100 | Temp 97.9°F | Ht 69.5 in | Wt 268.8 lb

## 2022-05-26 DIAGNOSIS — E119 Type 2 diabetes mellitus without complications: Secondary | ICD-10-CM | POA: Diagnosis not present

## 2022-05-26 DIAGNOSIS — L304 Erythema intertrigo: Secondary | ICD-10-CM

## 2022-05-26 DIAGNOSIS — E7800 Pure hypercholesterolemia, unspecified: Secondary | ICD-10-CM | POA: Diagnosis not present

## 2022-05-26 DIAGNOSIS — E1169 Type 2 diabetes mellitus with other specified complication: Secondary | ICD-10-CM

## 2022-05-26 DIAGNOSIS — Z1211 Encounter for screening for malignant neoplasm of colon: Secondary | ICD-10-CM

## 2022-05-26 DIAGNOSIS — Z6839 Body mass index (BMI) 39.0-39.9, adult: Secondary | ICD-10-CM

## 2022-05-26 DIAGNOSIS — E669 Obesity, unspecified: Secondary | ICD-10-CM

## 2022-05-26 DIAGNOSIS — L409 Psoriasis, unspecified: Secondary | ICD-10-CM

## 2022-05-26 LAB — POCT GLYCOSYLATED HEMOGLOBIN (HGB A1C): Hemoglobin A1C: 7 % — AB (ref 4.0–5.6)

## 2022-05-26 MED ORDER — CICLOPIROX 1 % EX SHAM
1.0000 "application " | MEDICATED_SHAMPOO | Freq: Every day | CUTANEOUS | 3 refills | Status: DC
  Filled 2022-05-26 – 2022-06-23 (×2): qty 120, 25d supply, fill #0
  Filled 2022-09-24: qty 120, 25d supply, fill #1

## 2022-05-26 MED ORDER — CLOBETASOL PROPIONATE 0.05 % EX SOLN
1.0000 "application " | Freq: Two times a day (BID) | CUTANEOUS | 3 refills | Status: DC
  Filled 2022-05-26: qty 50, 25d supply, fill #0
  Filled 2022-06-23: qty 50, 25d supply, fill #1
  Filled 2022-08-03: qty 50, 25d supply, fill #2

## 2022-05-26 MED ORDER — METFORMIN HCL ER 500 MG PO TB24
500.0000 mg | ORAL_TABLET | Freq: Two times a day (BID) | ORAL | 2 refills | Status: DC
  Filled 2022-05-26: qty 60, 30d supply, fill #0
  Filled 2022-07-13: qty 60, 30d supply, fill #1
  Filled 2022-08-24: qty 60, 30d supply, fill #2

## 2022-05-26 MED ORDER — FLUCONAZOLE 150 MG PO TABS
150.0000 mg | ORAL_TABLET | Freq: Every day | ORAL | 1 refills | Status: DC
  Filled 2022-05-26: qty 5, 5d supply, fill #0
  Filled 2022-11-23: qty 5, 5d supply, fill #1

## 2022-05-26 MED ORDER — CLOTRIMAZOLE 1 % EX CREA
1.0000 "application " | TOPICAL_CREAM | Freq: Two times a day (BID) | CUTANEOUS | 0 refills | Status: DC
  Filled 2022-05-26: qty 60, 30d supply, fill #0

## 2022-05-26 NOTE — Progress Notes (Signed)
Subjective:    Patient ID: Rachel Curtis, female    DOB: June 27, 1976, 46 y.o.   MRN: [id number]  Chief Complaint  Patient presents with   Follow-up    Pt coming in for 1 yr follow up; pt states completed physical with OBGYN and just needs physical with PCP; pt just concerned with weight and can't lose, skin rash that is yeast at under belly and powder prescribed doesn't work; discuss scalp irritation and wanting the topical solution that was used prior. Pt willing to order Cologuard if she can do instead of Coloscopy     HPI Patient is in today for one year follow-up. Several concerns today - needing labs updated; needs to discuss weight loss concerns; skin rash; scalp irritation; cologuard order  Pre-menopausal started in the Fall. -Sees Dr. Edward JollySilva, GYN -Ongoing menstrual cycle for 2 months  - put back on IUD - still having hot flashes - yeast recurrent - Vagisil / topical powder - only spotting now Hot flashes - makes scalp symptoms worse.  Ran out of Ciclopirox 1% shampoo & clobetasol solution.   Hx gestational diabetes. Family history. Very concerned she's diabetic. No symptoms. Poor diet, poor exercise, work / life balance not helpful right now.   Past Medical History:  Diagnosis Date   Allergy    Anxiety    History of chickenpox     Past Surgical History:  Procedure Laterality Date   CHOLECYSTECTOMY  2001   INTRAUTERINE DEVICE (IUD) INSERTION     mirena inserted 10-15-21   WISDOM TOOTH EXTRACTION  1999    Family History  Problem Relation Age of Onset   Hyperlipidemia Mother    Hypertension Mother    Diabetes Father    Heart disease Father    Arthritis Father    Breast cancer Maternal Aunt    Diabetes Maternal Grandmother    Dementia Paternal Grandmother    Alcohol abuse Paternal Grandfather    Cancer Maternal Uncle        colon ca    Social History   Tobacco Use   Smoking status: Never   Smokeless tobacco: Never  Vaping Use   Vaping Use: Never used   Substance Use Topics   Alcohol use: Yes    Comment: 1 drink/month   Drug use: No     No Known Allergies  Review of Systems NEGATIVE UNLESS OTHERWISE INDICATED IN HPI      Objective:     BP 134/88 (BP Location: Left Arm)   Pulse 100   Temp 97.9 F (36.6 C) (Temporal)   Ht 5' 9.5" (1.765 m)   Wt 268 lb 12.8 oz (121.9 kg)   SpO2 98%   BMI 39.13 kg/m   Wt Readings from Last 3 Encounters:  05/26/22 268 lb 12.8 oz (121.9 kg)  04/26/22 268 lb (121.6 kg)  12/15/21 274 lb (124.3 kg)    BP Readings from Last 3 Encounters:  05/26/22 134/88  04/26/22 134/68  12/15/21 120/80     Physical Exam Vitals and nursing note reviewed.  Constitutional:      General: She is not in acute distress.    Appearance: Normal appearance. She is obese. She is not ill-appearing.  HENT:     Right Ear: External ear normal.     Left Ear: External ear normal.  Eyes:     Extraocular Movements: Extraocular movements intact.     Conjunctiva/sclera: Conjunctivae normal.     Pupils: Pupils are equal, round, and reactive to  light.  Cardiovascular:     Rate and Rhythm: Normal rate and regular rhythm.     Pulses: Normal pulses.     Heart sounds: No murmur heard. Pulmonary:     Effort: Pulmonary effort is normal.     Breath sounds: Normal breath sounds.  Neurological:     General: No focal deficit present.     Mental Status: She is alert and oriented to person, place, and time.  Psychiatric:        Mood and Affect: Mood normal.        Behavior: Behavior normal.        Thought Content: Thought content normal.        Judgment: Judgment normal.        Assessment & Plan:   Problem List Items Addressed This Visit   None Visit Diagnoses     New onset type 2 diabetes mellitus (HCC)    -  Primary   Relevant Medications   metFORMIN (GLUCOPHAGE-XR) 500 MG 24 hr tablet   Other Relevant Orders   POCT HgB A1C (Completed)   CBC with Differential/Platelet   Comprehensive metabolic panel   Lipid  panel   TSH   Vitamin B12   Vitamin D (25 hydroxy)   Type 2 diabetes mellitus with obesity (HCC)       Relevant Medications   metFORMIN (GLUCOPHAGE-XR) 500 MG 24 hr tablet   Other Relevant Orders   CBC with Differential/Platelet   Comprehensive metabolic panel   Lipid panel   Elevated LDL cholesterol level       Relevant Orders   Lipid panel   Scalp psoriasis       Intertrigo       BMI 39.0-39.9,adult       Relevant Orders   CBC with Differential/Platelet   Comprehensive metabolic panel   Lipid panel   TSH   Vitamin B12   Vitamin D (25 hydroxy)   Screening for colon cancer       Relevant Orders   Cologuard        Meds ordered this encounter  Medications   fluconazole (DIFLUCAN) 150 MG tablet    Sig: Take 1 tablet (150 mg total) by mouth daily.    Dispense:  5 tablet    Refill:  1    Order Specific Question:   Supervising Provider    Answer:   Shelva Majestic [[id number]]   clotrimazole (ANTIFUNGAL, CLOTRIMAZOLE,) 1 % cream    Sig: Apply 1 application topically 2 (two) times daily.    Dispense:  60 g    Refill:  0    Order Specific Question:   Supervising Provider    Answer:   Shelva Majestic [[id number]]   metFORMIN (GLUCOPHAGE-XR) 500 MG 24 hr tablet    Sig: Take 1 tablet (500 mg total) by mouth 2 (two) times daily. Take with meals    Dispense:  60 tablet    Refill:  2    Order Specific Question:   Supervising Provider    Answer:   Shelva Majestic [[id number]]   Ciclopirox 1 % shampoo    Sig: Apply 1 application topically at bedtime.    Dispense:  120 mL    Refill:  3    Order Specific Question:   Supervising Provider    Answer:   Shelva Majestic [[id number]]   clobetasol (TEMOVATE) 0.05 % external solution    Sig: Apply 1 application topically 2 (two) times daily.  Dispense:  50 mL    Refill:  3    Order Specific Question:   Supervising Provider    Answer:   Tana Conch O [[id number]]    1. New onset type 2 diabetes mellitus (HCC) 2. Type 2 diabetes mellitus with  obesity (HCC) Lab Results  Component Value Date   HGBA1C 7.0 (A) 05/26/2022   I have discussed with the patient the pathophysiology of diabetes. We went over the natural progression of the disease. We talked about both insulin resistance and insulin deficiency. We stressed the importance of lifestyle changes including diet and exercise. I explained the complications associated with diabetes including retinopathy, nephropathy, neuropathy as well as increased risk of cardiovascular disease. We went over the benefit seen with glycemic control.   The patient was advised to look and examine their feet , wear proper fitting shoes and not to go barefoot.   Start Metformin 500 mg once daily then twice daily after one week.  Encouraged to ask insurance about GLP-1 coverage. Lifestyle changes greatly advised. An After Visit Summary was printed and given to the patient.   3. Elevated LDL cholesterol level Future fasting lipid panel  4. Scalp psoriasis Ciclopirox & clobetasol as directed  5. Intertrigo Clotrimazole, Diflucan, keep cool and dry.  6. BMI 39.0-39.9,adult Lifestyle changes - see AVS  7. Screening for colon cancer Cologuard ordered    Return in about 4 weeks (around 06/23/2022) for morning recheck .  This note was prepared with assistance of Conservation officer, historic buildings. Occasional wrong-word or sound-a-like substitutions may have occurred due to the inherent limitations of voice recognition software.    Rangel Echeverri M Ira Busbin, PA-C

## 2022-05-26 NOTE — Patient Instructions (Addendum)
Schedule for fasting labs   Start on Metformin as we discussed (once daily with dinner then increase to twice daily).  *Change coffee creamers *Add walking 10 minutes daily   *Please reach out to your insurance company - tell them new onset T2DM diagnosis - ask what are they willing to cover as far as once weekly GLP-1 injections (Mounjaro, Ozempic, [place], Trulicity).

## 2022-05-28 ENCOUNTER — Other Ambulatory Visit: Payer: Self-pay | Admitting: Physician Assistant

## 2022-05-28 ENCOUNTER — Emergency Department (INDEPENDENT_AMBULATORY_CARE_PROVIDER_SITE_OTHER)
Admission: EM | Admit: 2022-05-28 | Discharge: 2022-05-28 | Disposition: A | Discharge status: Home / Self Care | Payer: No Typology Code available for payment source | Source: Home / Self Care

## 2022-05-28 ENCOUNTER — Other Ambulatory Visit (HOSPITAL_COMMUNITY): Payer: Self-pay

## 2022-05-28 ENCOUNTER — Encounter: Payer: Self-pay | Admitting: Physician Assistant

## 2022-05-28 ENCOUNTER — Encounter: Payer: Self-pay | Admitting: Emergency Medicine

## 2022-05-28 DIAGNOSIS — H66004 Acute suppurative otitis media without spontaneous rupture of ear drum, recurrent, right ear: Secondary | ICD-10-CM | POA: Diagnosis not present

## 2022-05-28 DIAGNOSIS — H6507 Acute serous otitis media, recurrent, unspecified ear: Secondary | ICD-10-CM | POA: Insufficient documentation

## 2022-05-28 DIAGNOSIS — E669 Obesity, unspecified: Secondary | ICD-10-CM

## 2022-05-28 DIAGNOSIS — Z6839 Body mass index (BMI) 39.0-39.9, adult: Secondary | ICD-10-CM

## 2022-05-28 MED ORDER — AMOXICILLIN 500 MG PO CAPS: 1000.0000 mg | ORAL_CAPSULE | Freq: Two times a day (BID) | ORAL | 0 refills | Status: AC

## 2022-05-28 MED ORDER — FLUTICASONE PROPIONATE 50 MCG/ACT NA SUSP: 1.0000 | Freq: Two times a day (BID) | NASAL | 0 refills | Status: DC

## 2022-05-28 MED ORDER — FLUTICASONE PROPIONATE 50 MCG/ACT NA SUSP
1.0000 | Freq: Two times a day (BID) | NASAL | 0 refills | Status: DC
Start: 2022-05-28 — End: 2022-05-28

## 2022-05-28 MED ORDER — AMOXICILLIN 500 MG PO CAPS: 1000.0000 mg | ORAL_CAPSULE | Freq: Two times a day (BID) | ORAL | 0 refills | Status: DC

## 2022-05-28 MED ORDER — SEMAGLUTIDE(0.25 OR 0.5MG/DOS) 2 MG/3ML ~~LOC~~ SOPN
0.2500 mg | PEN_INJECTOR | SUBCUTANEOUS | 0 refills | Status: DC
  Filled 2022-05-28 – 2022-06-01 (×2): qty 3, 30d supply, fill #0

## 2022-05-28 NOTE — ED Triage Notes (Signed)
Patient c/o right ear pain that started this afternoon, headache, taken Advil for pain.

## 2022-05-28 NOTE — Telephone Encounter (Signed)
Please see pt msg and advise if you would like me to send in prescription for patient to start PA

## 2022-05-28 NOTE — ED Provider Notes (Signed)
[facility]    CSN: [id number] Arrival date & time: 05/28/22  1910      History   Chief Complaint Chief Complaint  Patient presents with   Otalgia    HPI Rachel Curtis is a 46 y.o. female.   The history is provided by the patient. No language interpreter was used.  Otalgia Location:  Right Behind ear:  No abnormality Quality:  Sharp and shooting Severity:  Moderate Onset quality:  Sudden Duration:  4 hours Timing:  Constant Progression:  Worsening Chronicity:  Recurrent (hx of recurrent ear infections; first one since before covid) Relieved by:  Nothing Exacerbated by: allergies, sinuses. Associated symptoms: congestion, headaches (sinus headaches - maxillary) and rhinorrhea   Associated symptoms: no cough, no diarrhea, no ear discharge, no fever, no hearing loss, no neck pain, no sore throat, no tinnitus and no vomiting   Associated symptoms comment:  Pt started with rhinorrhea and sinus congestion one week ago. Has been taking sudafed and other OTC meds x 1 week. Severe ear pain x today. Hx of these infections. Hx of allergies. Takes daily zyrtec   Past Medical History:  Diagnosis Date   Allergy    Anxiety    History of chickenpox     Patient Active Problem List   Diagnosis Date Noted   Allergic rhinitis due to pollen 01/12/2018   Eustachian tube dysfunction 01/12/2018   TMJ (dislocation of temporomandibular joint) 01/12/2018   Obesity (BMI 30.0-34.9) 04/28/2017    Past Surgical History:  Procedure Laterality Date   CHOLECYSTECTOMY  2001   INTRAUTERINE DEVICE (IUD) INSERTION     mirena inserted 10-15-21   WISDOM TOOTH EXTRACTION  1999    OB History     Gravida  3   Para  3   Term  3   Preterm      AB      Living  4      SAB      IAB      Ectopic      Multiple  1   Live Births  4            Home Medications    Prior to Admission medications   Medication Sig Start Date End Date Taking? Authorizing Provider   Ciclopirox 1 % shampoo Apply 1 application topically at bedtime. 05/26/22  Yes Allwardt, Alyssa M, PA-C  clobetasol (TEMOVATE) 0.05 % external solution Apply 1 application topically 2 (two) times daily. 05/26/22  Yes Allwardt, Alyssa M, PA-C  clotrimazole (ANTIFUNGAL, CLOTRIMAZOLE,) 1 % cream Apply 1 application topically 2 (two) times daily. 05/26/22  Yes Allwardt, Alyssa M, PA-C  fluconazole (DIFLUCAN) 150 MG tablet Take 1 tablet (150 mg total) by mouth daily. 05/26/22  Yes Allwardt, Crist InfanteAlyssa M, PA-C  levonorgestrel (MIRENA) 20 MCG/DAY IUD 1 each by Intrauterine route once.   Yes [provider]  metFORMIN (GLUCOPHAGE-XR) 500 MG 24 hr tablet Take 1 tablet (500 mg total) by mouth 2 (two) times daily. Take with meals 05/26/22  Yes Allwardt, Alyssa M, PA-C  nystatin (MYCOSTATIN/NYSTOP) powder Apply 1 application. topically 3 (three) times daily. Apply to affected area for up to 7 days 04/26/22  Yes Patton SallesAmundson C Silva, Brook E, MD  Semaglutide,0.25 or 0.5MG /DOS, 2 MG/3ML SOPN Inject 0.25 mg into the skin once a week. 05/28/22 06/27/22 Yes Allwardt, Alyssa M, PA-C  amoxicillin (AMOXIL) 500 MG capsule Take 2 capsules (1,000 mg total) by mouth 2 (two) times daily for 10 days. 05/28/22 06/07/22  Lesly Joslyn L, PA  fluticasone (FLONASE) 50 MCG/ACT nasal spray Place 1 spray into both nostrils in the morning and at bedtime. 05/28/22   Maretta Bees, PA    Family History Family History  Problem Relation Age of Onset   Hyperlipidemia Mother    Hypertension Mother    Diabetes Father    Heart disease Father    Arthritis Father    Breast cancer Maternal Aunt    Diabetes Maternal Grandmother    Dementia Paternal Grandmother    Alcohol abuse Paternal Grandfather    Cancer Maternal Uncle        colon ca    Social History Social History   Tobacco Use   Smoking status: Never   Smokeless tobacco: Never  Vaping Use   Vaping Use: Never used  Substance Use Topics   Alcohol use: Yes    Comment: 1 drink/month    Drug use: No     Allergies   Patient has no known allergies.   Review of Systems Review of Systems  Constitutional:  Negative for fever.  HENT:  Positive for congestion, ear pain and rhinorrhea. Negative for ear discharge, hearing loss, sore throat and tinnitus.   Respiratory:  Negative for cough.   Gastrointestinal:  Negative for diarrhea and vomiting.  Musculoskeletal:  Negative for neck pain.  Neurological:  Positive for headaches (sinus headaches - maxillary).     Physical Exam Triage Vital Signs ED Triage Vitals  Enc Vitals Group     BP 05/28/22 1920 (!) 149/89     Pulse Rate 05/28/22 1920 91     Resp 05/28/22 1920 18     Temp 05/28/22 1920 99.5 F (37.5 C)     Temp Source 05/28/22 1920 Oral     SpO2 05/28/22 1920 97 %     Weight 05/28/22 1922 270 lb (122.5 kg)     Height 05/28/22 1922 5' 9.5" (1.765 m)     Head Circumference --      Peak Flow --      Pain Score 05/28/22 1921 7     Pain Loc --      Pain Edu? --      Excl. in GC? --    No data found.  Updated Vital Signs BP (!) 149/89 (BP Location: Right Arm)   Pulse 91   Temp 99.5 F (37.5 C) (Oral)   Resp 18   Ht 5' 9.5" (1.765 m)   Wt 270 lb (122.5 kg)   SpO2 97%   BMI 39.30 kg/m   Visual Acuity Right Eye Distance:   Left Eye Distance:   Bilateral Distance:    Right Eye Near:   Left Eye Near:    Bilateral Near:     Physical Exam Vitals and nursing note reviewed.  Constitutional:      General: She is not in acute distress.    Appearance: Normal appearance. She is obese. She is not ill-appearing, toxic-appearing or diaphoretic.  HENT:     Head: Normocephalic and atraumatic.     Salivary Glands: Right salivary gland is not diffusely enlarged or tender. Left salivary gland is not diffusely enlarged or tender.     Right Ear: Ear canal and external ear normal. A middle ear effusion is present. There is no impacted cerumen. Tympanic membrane is injected, erythematous and bulging.     Left Ear:  Ear canal and external ear normal. A middle ear effusion (clear) is present. There is no impacted cerumen. Tympanic  membrane is not injected, scarred, perforated or erythematous.     Nose: Rhinorrhea present. No congestion. Rhinorrhea is clear.     Right Turbinates: Swollen.     Left Turbinates: Swollen.     Right Sinus: No maxillary sinus tenderness or frontal sinus tenderness.     Left Sinus: No maxillary sinus tenderness or frontal sinus tenderness.     Mouth/Throat:     Lips: Pink.     Mouth: Mucous membranes are moist.     Pharynx: Oropharynx is clear. Uvula midline. No pharyngeal swelling, posterior oropharyngeal erythema or uvula swelling.  Neurological:     Mental Status: She is alert.      UC Treatments / Results  Labs (all labs ordered are listed, but only abnormal results are displayed) Labs Reviewed - No data to display  EKG   Radiology No results found.  Procedures Procedures (including critical care time)  Medications Ordered in UC Medications - No data to display  Initial Impression / Assessment and Plan / UC Course  I have reviewed the triage vital signs and the nursing notes.  Pertinent labs & imaging results that were available during my care of the patient were reviewed by me and considered in my medical decision making (see chart for details).     Recurrent R OM - start amoxicillin 1g BID x 10 days. Likely stemming from sinus allergies. Continue home antihistamine. Add flonase. Consider daily sinus rinses. Yogurt and probiotic daily to prevent yeast/GI side effects.   Final Clinical Impressions(s) / UC Diagnoses   Final diagnoses:  Recurrent acute suppurative otitis media of right ear without spontaneous rupture of tympanic membrane     Discharge Instructions      You have otitis media, which is an infection behind your ear drum. This can be caused due to sinus issues and allergies. Please start using flonase nasal spray to help open your  eustachian tube. Use this over the next 7-10 days. Start taking 1g amoxicillin twice daily x 10 days.  Take with yogurt or an OTC probiotic to prevent diarrhea or yeast infection.  Purchase Lloyd Huger Med sinugator to help with seasonal allergies and prevention of sinus symptoms. This can be used daily.     ED Prescriptions     Medication Sig Dispense Auth. Provider   amoxicillin (AMOXIL) 500 MG capsule  (Status: Discontinued) Take 2 capsules (1,000 mg total) by mouth 2 (two) times daily for 10 days. 40 capsule Jericho Alcorn L, PA   fluticasone (FLONASE) 50 MCG/ACT nasal spray  (Status: Discontinued) Place 1 spray into both nostrils in the morning and at bedtime. 16 mL Abenezer Odonell L, PA   amoxicillin (AMOXIL) 500 MG capsule Take 2 capsules (1,000 mg total) by mouth 2 (two) times daily for 10 days. 40 capsule Tammara Massing L, PA   fluticasone (FLONASE) 50 MCG/ACT nasal spray Place 1 spray into both nostrils in the morning and at bedtime. 16 mL Bernadette Armijo L, PA      PDMP not reviewed this encounter.   Maretta Bees, [place] 05/28/22 1948

## 2022-05-28 NOTE — Discharge Instructions (Addendum)
You have otitis media, which is an infection behind your ear drum. This can be caused due to sinus issues and allergies. Please start using flonase nasal spray to help open your eustachian tube. Use this over the next 7-10 days. Start taking 1g amoxicillin twice daily x 10 days.  Take with yogurt or an OTC probiotic to prevent diarrhea or yeast infection.  Purchase Milta Deiters Med sinugator to help with seasonal allergies and prevention of sinus symptoms. This can be used daily.

## 2022-05-28 NOTE — Telephone Encounter (Signed)
Called pt and advised would start PA on Ozempic and to please continue taking Metformin as prescribed. Pt verbalized understanding

## 2022-05-31 NOTE — Telephone Encounter (Signed)
Rachel Curtis (Key: [id number]) Rx #: [id number] Ozempic (0.25 or 0.5 MG/DOSE) 2MG /3ML pen-injectors Waiting on determination

## 2022-06-01 ENCOUNTER — Encounter: Payer: Self-pay | Admitting: Physician Assistant

## 2022-06-01 ENCOUNTER — Other Ambulatory Visit (HOSPITAL_COMMUNITY): Payer: Self-pay

## 2022-06-01 NOTE — Telephone Encounter (Signed)
Rachel Curtis (Key: [id number]) Rx #: [id number] Ozempic (0.25 or 0.5 MG/DOSE) 2MG /3ML pen-injectors   The request has been approved. The authorization is effective for a maximum of 12 fills from 06/01/2022 to 06/01/2023. Pt has been advised of outcome via response to her MyChart message.

## 2022-06-23 ENCOUNTER — Other Ambulatory Visit (HOSPITAL_COMMUNITY): Payer: Self-pay

## 2022-06-25 ENCOUNTER — Ambulatory Visit (INDEPENDENT_AMBULATORY_CARE_PROVIDER_SITE_OTHER): Payer: No Typology Code available for payment source | Admitting: Physician Assistant

## 2022-06-25 ENCOUNTER — Encounter: Payer: Self-pay | Admitting: Physician Assistant

## 2022-06-25 ENCOUNTER — Other Ambulatory Visit: Payer: No Typology Code available for payment source

## 2022-06-25 ENCOUNTER — Other Ambulatory Visit (HOSPITAL_COMMUNITY): Payer: Self-pay

## 2022-06-25 VITALS — BP 118/78 | HR 88 | Temp 97.3°F | Ht 69.5 in | Wt 256.8 lb

## 2022-06-25 DIAGNOSIS — E7800 Pure hypercholesterolemia, unspecified: Secondary | ICD-10-CM

## 2022-06-25 DIAGNOSIS — Z6839 Body mass index (BMI) 39.0-39.9, adult: Secondary | ICD-10-CM | POA: Diagnosis not present

## 2022-06-25 DIAGNOSIS — E119 Type 2 diabetes mellitus without complications: Secondary | ICD-10-CM | POA: Diagnosis not present

## 2022-06-25 DIAGNOSIS — L409 Psoriasis, unspecified: Secondary | ICD-10-CM | POA: Diagnosis not present

## 2022-06-25 DIAGNOSIS — E669 Obesity, unspecified: Secondary | ICD-10-CM

## 2022-06-25 DIAGNOSIS — E6609 Other obesity due to excess calories: Secondary | ICD-10-CM

## 2022-06-25 DIAGNOSIS — E1169 Type 2 diabetes mellitus with other specified complication: Secondary | ICD-10-CM | POA: Diagnosis not present

## 2022-06-25 DIAGNOSIS — Z6837 Body mass index (BMI) 37.0-37.9, adult: Secondary | ICD-10-CM

## 2022-06-25 LAB — CBC WITH DIFFERENTIAL/PLATELET
Basophils Absolute: 0.1 10*3/uL (ref 0.0–0.1)
Basophils Relative: 0.7 % (ref 0.0–3.0)
Eosinophils Absolute: 0.1 10*3/uL (ref 0.0–0.7)
Eosinophils Relative: 1.6 % (ref 0.0–5.0)
HCT: 40.1 % (ref 36.0–46.0)
Hemoglobin: 13.5 g/dL (ref 12.0–15.0)
Lymphocytes Relative: 33.3 % (ref 12.0–46.0)
Lymphs Abs: 2.5 10*3/uL (ref 0.7–4.0)
MCHC: 33.6 g/dL (ref 30.0–36.0)
MCV: 87.1 fl (ref 78.0–100.0)
Monocytes Absolute: 0.5 10*3/uL (ref 0.1–1.0)
Monocytes Relative: 6.3 % (ref 3.0–12.0)
Neutro Abs: 4.4 10*3/uL (ref 1.4–7.7)
Neutrophils Relative %: 58.1 % (ref 43.0–77.0)
Platelets: 299 10*3/uL (ref 150.0–400.0)
RBC: 4.61 Mil/uL (ref 3.87–5.11)
RDW: 13.3 % (ref 11.5–15.5)
WBC: 7.5 10*3/uL (ref 4.0–10.5)

## 2022-06-25 LAB — COMPREHENSIVE METABOLIC PANEL
ALT: 45 U/L — ABNORMAL HIGH (ref 0–35)
AST: 33 U/L (ref 0–37)
Albumin: 4.6 g/dL (ref 3.5–5.2)
Alkaline Phosphatase: 69 U/L (ref 39–117)
BUN: 13 mg/dL (ref 6–23)
CO2: 25 mEq/L (ref 19–32)
Calcium: 10.3 mg/dL (ref 8.4–10.5)
Chloride: 102 mEq/L (ref 96–112)
Creatinine, Ser: 0.82 mg/dL (ref 0.40–1.20)
GFR: 85.8 mL/min (ref >60.00)
Glucose, Bld: 97 mg/dL (ref 70–99)
Potassium: 4.2 mEq/L (ref 3.5–5.1)
Sodium: 138 mEq/L (ref 135–145)
Total Bilirubin: 0.6 mg/dL (ref 0.2–1.2)
Total Protein: 7 g/dL (ref 6.0–8.3)

## 2022-06-25 LAB — VITAMIN D 25 HYDROXY (VIT D DEFICIENCY, FRACTURES): VITD: 20.92 ng/mL — ABNORMAL LOW (ref 30.00–100.00)

## 2022-06-25 LAB — LDL CHOLESTEROL, DIRECT: Direct LDL: 140 mg/dL

## 2022-06-25 LAB — TSH: TSH: 2.79 u[IU]/mL (ref 0.35–5.50)

## 2022-06-25 LAB — LIPID PANEL
Cholesterol: 221 mg/dL — ABNORMAL HIGH (ref 0–200)
HDL: 34.6 mg/dL — ABNORMAL LOW (ref >39.00)
NonHDL: 186.41
Total CHOL/HDL Ratio: 6
Triglycerides: 376 mg/dL — ABNORMAL HIGH (ref 0.0–149.0)
VLDL: 75.2 mg/dL — ABNORMAL HIGH (ref 0.0–40.0)

## 2022-06-25 LAB — VITAMIN B12: Vitamin B-12: 507 pg/mL (ref 211–911)

## 2022-06-25 MED ORDER — SEMAGLUTIDE(0.25 OR 0.5MG/DOS) 2 MG/3ML ~~LOC~~ SOPN
0.5000 mg | PEN_INJECTOR | SUBCUTANEOUS | 0 refills | Status: AC
  Filled 2022-06-25: qty 3, 30d supply, fill #0

## 2022-06-25 NOTE — Progress Notes (Signed)
Subjective:    Patient ID: Rachel Curtis, female    DOB: 13-Apr-1976, 46 y.o.   MRN: [id number]  Chief Complaint  Patient presents with   Follow-up    Pt coming in for 4 wk f/u; pt states that hair is falling out with medication no other side effects; pt has Cologuard and will be collecting soon to send in to be resulted    HPI Patient is in today for 4 week f/up from last visit.  Blood pressure is down, weight is 12 lbs down. She has really worked on her diet changes. No sweet tea. Less carbs. More veggies (only likes broccoli, picky eater she says). Drinking more water. Has had 3 injections of Ozempic so far and taking Metformin once in the evenings. Taking biotin and increasing protein to help with hair loss. Thinks this is a SE of the Ozempic. Using shampoos for scalp psoriasis & says this is calmed down right now. Feeling very good overall. 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   No other symptoms or SE concerns at this time.   Past Medical History:  Diagnosis Date   Allergy    Anxiety    History of chickenpox     Past Surgical History:  Procedure Laterality Date   CHOLECYSTECTOMY  2001   INTRAUTERINE DEVICE (IUD) INSERTION     mirena inserted 10-15-21   WISDOM TOOTH EXTRACTION  1999    Family History  Problem Relation Age of Onset   Hyperlipidemia Mother    Hypertension Mother    Diabetes Father    Heart disease Father    Arthritis Father    Breast cancer Maternal Aunt    Diabetes Maternal Grandmother    Dementia Paternal Grandmother    Alcohol abuse Paternal Grandfather    Cancer Maternal Uncle        colon ca    Social History   Tobacco Use   Smoking status: Never   Smokeless tobacco: Never  Vaping Use   Vaping Use: Never used  Substance Use Topics   Alcohol use: Yes    Comment: 1 drink/month   Drug use: No     No Known Allergies  Review of Systems NEGATIVE UNLESS OTHERWISE INDICATED IN HPI       Objective:     BP 118/78 (BP Location: Right Arm)   Pulse 88   Temp (!) 97.3 F (36.3 C) (Temporal)   Ht 5' 9.5" (1.765 m)   Wt 256 lb 12.8 oz (116.5 kg)   SpO2 98%   BMI 37.38 kg/m   Wt Readings from Last 3 Encounters:  06/25/22 256 lb 12.8 oz (116.5 kg)  05/28/22 270 lb (122.5 kg)  05/26/22 268 lb 12.8 oz (121.9 kg)    BP Readings from Last 3 Encounters:  06/25/22 118/78  05/28/22 (!) 149/89  05/26/22 134/88     Physical Exam Vitals and nursing note reviewed.  Constitutional:      General: She is not in acute distress.    Appearance: Normal appearance. She is obese. She is not ill-appearing.  Eyes:     Extraocular Movements: Extraocular movements intact.     Conjunctiva/sclera: Conjunctivae normal.     Pupils: Pupils are equal, round, and reactive to light.  Cardiovascular:     Rate and Rhythm: Normal rate and regular rhythm.     Pulses: Normal pulses.     Heart sounds: No murmur heard. Pulmonary:  Effort: Pulmonary effort is normal.     Breath sounds: Normal breath sounds.  Skin:    Comments: Scalp looks healthy; some areas of thinning  Neurological:     General: No focal deficit present.     Mental Status: She is alert and oriented to person, place, and time.  Psychiatric:        Mood and Affect: Mood normal.        Behavior: Behavior normal.        Assessment & Plan:   Problem List Items Addressed This Visit   None Visit Diagnoses     Type 2 diabetes mellitus with obesity (HCC)    -  Primary   Relevant Medications   Semaglutide,0.25 or 0.5MG /DOS, 2 MG/3ML SOPN   Class 2 obesity due to excess calories without serious comorbidity with body mass index (BMI) of 37.0 to 37.9 in adult       Relevant Medications   Semaglutide,0.25 or 0.5MG /DOS, 2 MG/3ML SOPN   BMI 39.0-39.9,adult       Relevant Medications   Semaglutide,0.25 or 0.5MG /DOS, 2 MG/3ML SOPN        Meds ordered this encounter  Medications   Semaglutide,0.25 or 0.5MG /DOS, 2  MG/3ML SOPN    Sig: Inject 0.5 mg into the skin once a week.    Dispense:  3 mL    Refill:  0    Order Specific Question:   Supervising Provider    Answer:   Tana Conch O [[id number]]   1. Type 2 diabetes mellitus with obesity (HCC) 2. Class 2 obesity due to excess calories without serious comorbidity with body mass index (BMI) of 37.0 to 37.9 in adult Congratulated her so far on weight loss / diet changes Inc Metformin 500 mg to BID Inc Ozempic to 0.5 mg once weekly Keep up good work  3. Scalp psoriasis Controlled, stable with ciclopirox shampoo and clobetasol solution Call for refills   4. BMI 39.0-39.9,adult  Return in about 4 weeks (around 07/23/2022) for weight / medication recheck .  This note was prepared with assistance of Conservation officer, historic buildings. Occasional wrong-word or sound-a-like substitutions may have occurred due to the inherent limitations of voice recognition software.    Tuck Dulworth M Emet Rafanan, PA-C

## 2022-06-27 ENCOUNTER — Other Ambulatory Visit: Payer: Self-pay | Admitting: Physician Assistant

## 2022-06-27 MED ORDER — VITAMIN D (ERGOCALCIFEROL) 1.25 MG (50000 UNIT) PO CAPS
50000.0000 [IU] | ORAL_CAPSULE | ORAL | 0 refills | Status: DC
  Filled 2022-06-27: qty 12, 84d supply, fill #0

## 2022-06-28 ENCOUNTER — Other Ambulatory Visit (HOSPITAL_COMMUNITY): Payer: Self-pay

## 2022-07-13 ENCOUNTER — Other Ambulatory Visit (HOSPITAL_COMMUNITY): Payer: Self-pay

## 2022-07-27 ENCOUNTER — Ambulatory Visit: Payer: No Typology Code available for payment source | Admitting: Physician Assistant

## 2022-08-03 ENCOUNTER — Encounter: Payer: Self-pay | Admitting: Physician Assistant

## 2022-08-03 ENCOUNTER — Other Ambulatory Visit (HOSPITAL_COMMUNITY): Payer: Self-pay

## 2022-08-03 ENCOUNTER — Ambulatory Visit (INDEPENDENT_AMBULATORY_CARE_PROVIDER_SITE_OTHER): Payer: No Typology Code available for payment source | Admitting: Physician Assistant

## 2022-08-03 VITALS — BP 118/80 | HR 85 | Temp 97.0°F | Ht 69.5 in | Wt 249.4 lb

## 2022-08-03 DIAGNOSIS — E6609 Other obesity due to excess calories: Secondary | ICD-10-CM | POA: Diagnosis not present

## 2022-08-03 DIAGNOSIS — E669 Obesity, unspecified: Secondary | ICD-10-CM | POA: Diagnosis not present

## 2022-08-03 DIAGNOSIS — E1169 Type 2 diabetes mellitus with other specified complication: Secondary | ICD-10-CM | POA: Diagnosis not present

## 2022-08-03 DIAGNOSIS — Z6836 Body mass index (BMI) 36.0-36.9, adult: Secondary | ICD-10-CM | POA: Diagnosis not present

## 2022-08-03 MED ORDER — OZEMPIC (0.25 OR 0.5 MG/DOSE) 2 MG/3ML ~~LOC~~ SOPN
0.5000 mg | PEN_INJECTOR | SUBCUTANEOUS | 1 refills | Status: DC
  Filled 2022-08-03: qty 3, 28d supply, fill #0

## 2022-08-03 NOTE — Patient Instructions (Addendum)
Down 21 lbs!!!!!!!!!!!!!!!!!!!!!!!!!!! So proud of you! Keep up those lifestyle changes! We'll stay at 0.5 mg once weekly dosing for now.

## 2022-08-03 NOTE — Progress Notes (Signed)
Subjective:    Patient ID: Rachel Curtis, female    DOB: 23-Apr-1976, 46 y.o.   MRN: [id number]  Chief Complaint  Patient presents with   Follow-up    Pt in for weight check and discuss medication; pt states only side effect she is noticing has been some hair loss; Pt has Cologuard at home and waiting for a time she will be home to complete and send in.    HPI Patient is in today for f/up weight and T2DM. Very excited about weight loss. 2 months of Ozempic so far, tolerating well, just noticing minor hair loss. Still taking Metformin as well. Major dietary changes - sugar-free creamer, no fast-food, less sweets - only a bite at a time. Just trying to get more exercise in.   Past Medical History:  Diagnosis Date   Allergy    Anxiety    History of chickenpox     Past Surgical History:  Procedure Laterality Date   CHOLECYSTECTOMY  2001   INTRAUTERINE DEVICE (IUD) INSERTION     mirena inserted 10-15-21   WISDOM TOOTH EXTRACTION  1999    Family History  Problem Relation Age of Onset   Hyperlipidemia Mother    Hypertension Mother    Diabetes Father    Heart disease Father    Arthritis Father    Breast cancer Maternal Aunt    Diabetes Maternal Grandmother    Dementia Paternal Grandmother    Alcohol abuse Paternal Grandfather    Cancer Maternal Uncle        colon ca    Social History   Tobacco Use   Smoking status: Never   Smokeless tobacco: Never  Vaping Use   Vaping Use: Never used  Substance Use Topics   Alcohol use: Yes    Comment: 1 drink/month   Drug use: No     No Known Allergies  Review of Systems NEGATIVE UNLESS OTHERWISE INDICATED IN HPI      Objective:     BP 118/80 (BP Location: Left Arm)   Pulse 85   Temp (!) 97 F (36.1 C) (Temporal)   Ht 5' 9.5" (1.765 m)   Wt 249 lb 6.4 oz (113.1 kg)   SpO2 94%   BMI 36.30 kg/m   Wt Readings from Last 3 Encounters:  08/03/22 249 lb 6.4 oz (113.1 kg)  06/25/22 256 lb 12.8 oz (116.5 kg)   05/28/22 270 lb (122.5 kg)    BP Readings from Last 3 Encounters:  08/03/22 118/80  06/25/22 118/78  05/28/22 (!) 149/89     Physical Exam Vitals and nursing note reviewed.  Constitutional:      General: She is not in acute distress.    Appearance: Normal appearance. She is obese. She is not ill-appearing.  Eyes:     Extraocular Movements: Extraocular movements intact.     Conjunctiva/sclera: Conjunctivae normal.     Pupils: Pupils are equal, round, and reactive to light.  Cardiovascular:     Rate and Rhythm: Normal rate and regular rhythm.     Pulses: Normal pulses.     Heart sounds: No murmur heard. Pulmonary:     Effort: Pulmonary effort is normal.     Breath sounds: Normal breath sounds.  Neurological:     General: No focal deficit present.     Mental Status: She is alert and oriented to person, place, and time.  Psychiatric:        Mood and Affect: Mood normal.  Behavior: Behavior normal.        Assessment & Plan:   Problem List Items Addressed This Visit   None Visit Diagnoses     Type 2 diabetes mellitus with obesity (HCC)    -  Primary   Relevant Medications   Semaglutide,0.25 or 0.5MG /DOS, (OZEMPIC, 0.25 OR 0.5 MG/DOSE,) 2 MG/1.5ML SOPN   Class 2 obesity due to excess calories without serious comorbidity with body mass index (BMI) of 36.0 to 36.9 in adult       Relevant Medications   Semaglutide,0.25 or 0.5MG /DOS, (OZEMPIC, 0.25 OR 0.5 MG/DOSE,) 2 MG/1.5ML SOPN        Meds ordered this encounter  Medications   Semaglutide,0.25 or 0.5MG /DOS, (OZEMPIC, 0.25 OR 0.5 MG/DOSE,) 2 MG/1.5ML SOPN    Sig: Inject 0.5 mg into the skin once a week.    Dispense:  1.5 mL    Refill:  1    Order Specific Question:   Supervising Provider    Answer:   Shelva Majestic [[id number]]   Plan: So proud of her! Congratulated on 21 lb weight loss so far. Stay at Ozempic 0.5 mg once weekly x 4 weeks. Cont lifestyle changes. Incorporate more walking. Recheck POC Ha1c  next visit.   Return in about 4 weeks (around 08/31/2022) for recheck .    Marchia Diguglielmo M Carina Chaplin, PA-C

## 2022-08-24 ENCOUNTER — Other Ambulatory Visit (HOSPITAL_COMMUNITY): Payer: Self-pay

## 2022-08-26 ENCOUNTER — Encounter: Payer: Self-pay | Admitting: Physician Assistant

## 2022-08-26 ENCOUNTER — Ambulatory Visit (INDEPENDENT_AMBULATORY_CARE_PROVIDER_SITE_OTHER): Payer: No Typology Code available for payment source | Admitting: Physician Assistant

## 2022-08-26 ENCOUNTER — Other Ambulatory Visit (HOSPITAL_COMMUNITY): Payer: Self-pay

## 2022-08-26 VITALS — BP 128/82 | HR 80 | Temp 97.7°F | Ht 69.5 in | Wt 247.4 lb

## 2022-08-26 DIAGNOSIS — E1169 Type 2 diabetes mellitus with other specified complication: Secondary | ICD-10-CM | POA: Diagnosis not present

## 2022-08-26 DIAGNOSIS — E669 Obesity, unspecified: Secondary | ICD-10-CM | POA: Diagnosis not present

## 2022-08-26 DIAGNOSIS — Z6836 Body mass index (BMI) 36.0-36.9, adult: Secondary | ICD-10-CM

## 2022-08-26 DIAGNOSIS — E6609 Other obesity due to excess calories: Secondary | ICD-10-CM | POA: Diagnosis not present

## 2022-08-26 LAB — POCT GLYCOSYLATED HEMOGLOBIN (HGB A1C): Hemoglobin A1C: 5.3 % (ref 4.0–5.6)

## 2022-08-26 MED ORDER — SEMAGLUTIDE (1 MG/DOSE) 4 MG/3ML ~~LOC~~ SOPN
1.0000 mg | PEN_INJECTOR | SUBCUTANEOUS | 2 refills | Status: AC
  Filled 2022-08-26: qty 3, 28d supply, fill #0

## 2022-08-26 NOTE — Assessment & Plan Note (Signed)
Patient has been doing well with metformin 500 mg twice daily as well as Ozempic 0.5 mg once weekly.  She is working hard on lifestyle changes.  Congratulated her on her continued weight loss and amazing lowering of her hemoglobin A1c.   Lab Results  Component Value Date   HGBA1C 5.3 08/26/2022

## 2022-08-26 NOTE — Progress Notes (Signed)
Subjective:    Patient ID: Rachel Curtis, female    DOB: 1976-10-19, 46 y.o.   MRN: [id number]  Chief Complaint  Patient presents with   Follow-up    Pt being seen for 4 wk f/u; pt states medicine still working well; minor side effects with hair loss but taking biotin to try to help;     HPI Patient is in today for 4-week follow-up.  She has been taking Ozempic 0.5 mg once weekly and doing well.  She denies any major side effects or problems with the medication, other than experiencing some hair thinning.  She states that she is having her hairdresser keep an eye on things and she is not having any large clumps in one area coming out; she is just having all over hair thinning.  No new medical concerns or changes since last visit.  She is excited to have lost another 2 pounds and continues to work on lifestyle changes at home.   Past Medical History:  Diagnosis Date   Allergy    Anxiety    History of chickenpox     Past Surgical History:  Procedure Laterality Date   CHOLECYSTECTOMY  2001   INTRAUTERINE DEVICE (IUD) INSERTION     mirena inserted 10-15-21   WISDOM TOOTH EXTRACTION  1999    Family History  Problem Relation Age of Onset   Hyperlipidemia Mother    Hypertension Mother    Diabetes Father    Heart disease Father    Arthritis Father    Breast cancer Maternal Aunt    Diabetes Maternal Grandmother    Dementia Paternal Grandmother    Alcohol abuse Paternal Grandfather    Cancer Maternal Uncle        colon ca    Social History   Tobacco Use   Smoking status: Never   Smokeless tobacco: Never  Vaping Use   Vaping Use: Never used  Substance Use Topics   Alcohol use: Yes    Comment: 1 drink/month   Drug use: No     No Known Allergies  Review of Systems NEGATIVE UNLESS OTHERWISE INDICATED IN HPI      Objective:     BP 128/82 (BP Location: Left Arm)   Pulse 80   Temp 97.7 F (36.5 C) (Temporal)   Ht 5' 9.5" (1.765 m)   Wt 247 lb 6.4 oz (112.2  kg)   SpO2 99%   BMI 36.01 kg/m   Wt Readings from Last 3 Encounters:  08/26/22 247 lb 6.4 oz (112.2 kg)  08/03/22 249 lb 6.4 oz (113.1 kg)  06/25/22 256 lb 12.8 oz (116.5 kg)    BP Readings from Last 3 Encounters:  08/26/22 128/82  08/03/22 118/80  06/25/22 118/78     Physical Exam Constitutional:      Appearance: Normal appearance. She is obese.  Cardiovascular:     Rate and Rhythm: Normal rate and regular rhythm.     Pulses: Normal pulses.  Pulmonary:     Effort: Pulmonary effort is normal.     Breath sounds: Normal breath sounds.  Neurological:     Mental Status: She is alert.  Psychiatric:        Mood and Affect: Mood normal.        Behavior: Behavior normal.        Thought Content: Thought content normal.        Assessment & Plan:  Type 2 diabetes mellitus with obesity [facility]) Assessment & Plan: Patient has  been doing well with metformin 500 mg twice daily as well as Ozempic 0.5 mg once weekly.  She is working hard on lifestyle changes.  Congratulated her on her continued weight loss and amazing lowering of her hemoglobin A1c.   Lab Results  Component Value Date   HGBA1C 5.3 08/26/2022     Orders: -     POCT glycosylated hemoglobin (Hb A1C)  Class 2 obesity due to excess calories without serious comorbidity with body mass index (BMI) of 36.0 to 36.9 in adult Assessment & Plan: Patient is doing well with Ozempic and lifestyle changes.  She has started to plateau and we can increase the Ozempic to 1 mg once weekly at this time.    Other orders -     Semaglutide (1 MG/DOSE); Inject 1 mg as directed once a week.  Dispense: 3 mL; Refill: 2      This note was prepared with assistance of Dragon voice recognition software. Occasional wrong-word or sound-a-like substitutions may have occurred due to the inherent limitations of voice recognition software.    Ezrael Sam M Floreen Teegarden, PA-C

## 2022-08-26 NOTE — Assessment & Plan Note (Signed)
Patient is doing well with Ozempic and lifestyle changes.  She has started to plateau and we can increase the Ozempic to 1 mg once weekly at this time.

## 2022-09-24 ENCOUNTER — Other Ambulatory Visit (HOSPITAL_COMMUNITY): Payer: Self-pay

## 2022-09-24 ENCOUNTER — Ambulatory Visit (INDEPENDENT_AMBULATORY_CARE_PROVIDER_SITE_OTHER): Payer: No Typology Code available for payment source | Admitting: Physician Assistant

## 2022-09-24 ENCOUNTER — Encounter: Payer: Self-pay | Admitting: Physician Assistant

## 2022-09-24 VITALS — BP 118/80 | HR 90 | Temp 97.3°F | Ht 69.0 in | Wt 243.4 lb

## 2022-09-24 DIAGNOSIS — E1169 Type 2 diabetes mellitus with other specified complication: Secondary | ICD-10-CM

## 2022-09-24 DIAGNOSIS — K219 Gastro-esophageal reflux disease without esophagitis: Secondary | ICD-10-CM

## 2022-09-24 DIAGNOSIS — Z23 Encounter for immunization: Secondary | ICD-10-CM | POA: Diagnosis not present

## 2022-09-24 DIAGNOSIS — L409 Psoriasis, unspecified: Secondary | ICD-10-CM | POA: Insufficient documentation

## 2022-09-24 DIAGNOSIS — E669 Obesity, unspecified: Secondary | ICD-10-CM

## 2022-09-24 MED ORDER — CLOBETASOL PROPIONATE 0.05 % EX SOLN
1.0000 | Freq: Two times a day (BID) | CUTANEOUS | 5 refills | Status: DC
  Filled 2022-09-24: qty 25, 30d supply, fill #0
  Filled 2022-10-19: qty 50, 25d supply, fill #1
  Filled 2022-11-23: qty 50, 25d supply, fill #2

## 2022-09-24 MED ORDER — PANTOPRAZOLE SODIUM 40 MG PO TBEC
40.0000 mg | DELAYED_RELEASE_TABLET | Freq: Every day | ORAL | 2 refills | Status: DC
  Filled 2022-09-24: qty 30, 30d supply, fill #0

## 2022-09-24 MED ORDER — SEMAGLUTIDE (2 MG/DOSE) 8 MG/3ML ~~LOC~~ SOPN
2.0000 mg | PEN_INJECTOR | SUBCUTANEOUS | 2 refills | Status: DC
  Filled 2022-09-24: qty 3, 28d supply, fill #0
  Filled 2022-10-19: qty 3, 28d supply, fill #1
  Filled 2022-11-23: qty 3, 28d supply, fill #2

## 2022-09-24 NOTE — Assessment & Plan Note (Signed)
Clobetasol solution refilled, controlled.

## 2022-09-24 NOTE — Patient Instructions (Addendum)
Stop the Metformin to see if this helps some the GI symptoms   Increase Ozempic to 2 mg once weekly  Protonix once daily on empty stomach prior to meals   Small frequent meals

## 2022-09-24 NOTE — Assessment & Plan Note (Signed)
Protonix 40 mg daily May take Tums, Pepcid in-between prn

## 2022-09-24 NOTE — Assessment & Plan Note (Signed)
Lab Results  Component Value Date   HGBA1C 5.3 08/26/2022   Stop Metformin at this time - will see if this helps resolve some GI symptoms (frequent loose stools, GERD, abdominal cramping)  Increase Ozempic to 2 mg once weekly to help with continued weight loss goal. Pt aware of risks vs benefits and possible adverse reactions.

## 2022-09-24 NOTE — Assessment & Plan Note (Signed)
Continuing weight loss - doing great! Will increase Ozempic to 2 mg once weekly. Patient has ultimate goal to be under 200 lbs. Continued work on lifestyle.

## 2022-09-24 NOTE — Progress Notes (Signed)
Subjective:    Patient ID: Rachel Curtis, female    DOB: 1976/08/24, 46 y.o.   MRN: [id number]  Chief Complaint  Patient presents with   Follow-up    Pt in for f/u and flu vaccine;     HPI Patient is in today for 4 week f/up T2DM and weight check. See A/P.   Past Medical History:  Diagnosis Date   Allergy    Anxiety    History of chickenpox     Past Surgical History:  Procedure Laterality Date   CHOLECYSTECTOMY  2001   INTRAUTERINE DEVICE (IUD) INSERTION     mirena inserted 10-15-21   WISDOM TOOTH EXTRACTION  1999    Family History  Problem Relation Age of Onset   Hyperlipidemia Mother    Hypertension Mother    Diabetes Father    Heart disease Father    Arthritis Father    Breast cancer Maternal Aunt    Diabetes Maternal Grandmother    Dementia Paternal Grandmother    Alcohol abuse Paternal Grandfather    Cancer Maternal Uncle        colon ca    Social History   Tobacco Use   Smoking status: Never   Smokeless tobacco: Never  Vaping Use   Vaping Use: Never used  Substance Use Topics   Alcohol use: Yes    Comment: 1 drink/month   Drug use: No     No Known Allergies  Review of Systems NEGATIVE UNLESS OTHERWISE INDICATED IN HPI      Objective:     BP 118/80 (BP Location: Right Arm)   Pulse 90   Temp (!) 97.3 F (36.3 C) (Temporal)   Ht 5\' 9"  (1.753 m)   Wt 243 lb 6.4 oz (110.4 kg)   SpO2 96%   BMI 35.94 kg/m   Wt Readings from Last 3 Encounters:  09/24/22 243 lb 6.4 oz (110.4 kg)  08/26/22 247 lb 6.4 oz (112.2 kg)  08/03/22 249 lb 6.4 oz (113.1 kg)    BP Readings from Last 3 Encounters:  09/24/22 118/80  08/26/22 128/82  08/03/22 118/80     Physical Exam Constitutional:      Appearance: Normal appearance. She is obese.  Cardiovascular:     Rate and Rhythm: Normal rate and regular rhythm.     Pulses: Normal pulses.     Heart sounds: Normal heart sounds. No murmur heard. Pulmonary:     Effort: Pulmonary effort is normal.      Breath sounds: Normal breath sounds.  Neurological:     Mental Status: She is alert.  Psychiatric:        Mood and Affect: Mood normal.        Thought Content: Thought content normal.        Assessment & Plan:  Type 2 diabetes mellitus with obesity (HCC) Assessment & Plan: Lab Results  Component Value Date   HGBA1C 5.3 08/26/2022   Stop Metformin at this time - will see if this helps resolve some GI symptoms (frequent loose stools, GERD, abdominal cramping)  Increase Ozempic to 2 mg once weekly to help with continued weight loss goal. Pt aware of risks vs benefits and possible adverse reactions.   Obesity, Class II, BMI 35-39.9 Assessment & Plan: Continuing weight loss - doing great! Will increase Ozempic to 2 mg once weekly. Patient has ultimate goal to be under 200 lbs. Continued work on lifestyle.    Scalp psoriasis Assessment & Plan: Clobetasol solution refilled,  controlled.    Gastroesophageal reflux disease without esophagitis Assessment & Plan: Protonix 40 mg daily May take Tums, Pepcid in-between prn   Need for immunization against influenza -     Flu Vaccine QUAD 31mo+IM (Fluarix, Fluzone & Alfiuria Quad PF)  Other orders -     Clobetasol Propionate; Apply 1 application topically 2 (two) times daily.  Dispense: 50 mL; Refill: 5 -     Semaglutide (2 MG/DOSE); Inject 2 mg as directed once a week.  Dispense: 3 mL; Refill: 2 -     Pantoprazole Sodium; Take 1 tablet (40 mg total) by mouth daily.  Dispense: 30 tablet; Refill: 2        Return in about 4 weeks (around 10/22/2022) for recheck .    Annaliyah Willig M Garnette Greb, PA-C

## 2022-10-19 ENCOUNTER — Other Ambulatory Visit (HOSPITAL_COMMUNITY): Payer: Self-pay

## 2022-11-02 ENCOUNTER — Ambulatory Visit (INDEPENDENT_AMBULATORY_CARE_PROVIDER_SITE_OTHER): Payer: No Typology Code available for payment source | Admitting: Physician Assistant

## 2022-11-02 ENCOUNTER — Encounter: Payer: Self-pay | Admitting: Physician Assistant

## 2022-11-02 VITALS — BP 130/81 | HR 85 | Temp 98.0°F | Ht 69.0 in | Wt 237.0 lb

## 2022-11-02 DIAGNOSIS — E669 Obesity, unspecified: Secondary | ICD-10-CM | POA: Diagnosis not present

## 2022-11-02 NOTE — Progress Notes (Signed)
   Subjective:    Patient ID: Rachel Curtis, female    DOB: 04/27/1976, 46 y.o.   MRN: [id number]  Chief Complaint  Patient presents with   Follow-up    Pt had no questions or concerns    Diabetes    HPI Patient is in today for follow-up on weight loss.   Past Medical History:  Diagnosis Date   Allergy    Anxiety    History of chickenpox     Past Surgical History:  Procedure Laterality Date   CHOLECYSTECTOMY  2001   INTRAUTERINE DEVICE (IUD) INSERTION     mirena inserted 10-15-21   WISDOM TOOTH EXTRACTION  1999    Family History  Problem Relation Age of Onset   Hyperlipidemia Mother    Hypertension Mother    Diabetes Father    Heart disease Father    Arthritis Father    Breast cancer Maternal Aunt    Diabetes Maternal Grandmother    Dementia Paternal Grandmother    Alcohol abuse Paternal Grandfather    Cancer Maternal Uncle        colon ca    Social History   Tobacco Use   Smoking status: Never   Smokeless tobacco: Never  Vaping Use   Vaping Use: Never used  Substance Use Topics   Alcohol use: Yes    Comment: 1 drink/month   Drug use: No     No Known Allergies  Review of Systems NEGATIVE UNLESS OTHERWISE INDICATED IN HPI      Objective:     BP 130/81 (BP Location: Left Arm, Patient Position: Sitting)   Pulse 85   Temp 98 F (36.7 C) (Temporal)   Ht 5\' 9"  (1.753 m)   Wt 237 lb (107.5 kg)   LMP 10/22/2022   SpO2 98%   BMI 35.00 kg/m   Wt Readings from Last 3 Encounters:  11/02/22 237 lb (107.5 kg)  09/24/22 243 lb 6.4 oz (110.4 kg)  08/26/22 247 lb 6.4 oz (112.2 kg)    BP Readings from Last 3 Encounters:  11/02/22 130/81  09/24/22 118/80  08/26/22 128/82     Physical Exam Constitutional:      Appearance: Normal appearance.  Cardiovascular:     Rate and Rhythm: Normal rate and regular rhythm.  Pulmonary:     Effort: Pulmonary effort is normal.  Neurological:     General: No focal deficit present.     Mental Status: She  is alert and oriented to person, place, and time.  Psychiatric:        Mood and Affect: Mood normal.        Assessment & Plan:  Obesity, Class II, BMI 35-39.9 Assessment & Plan: Continues to do well with Ozempic 2 mg once weekly. Loss of 33 lbs total since June 2023. Happy with results. Continues to work on lifestyle changes at home. 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         Return in about 8 weeks (around 12/28/2022) for recheck .    Jayona Mccaig M Shakirah Kirkey, PA-C

## 2022-11-02 NOTE — Assessment & Plan Note (Signed)
Continues to do well with Ozempic 2 mg once weekly. Loss of 33 lbs total since June 2023. Happy with results. Continues to work on lifestyle changes at home. 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

## 2022-11-23 ENCOUNTER — Other Ambulatory Visit (HOSPITAL_COMMUNITY): Payer: Self-pay

## 2022-12-17 ENCOUNTER — Other Ambulatory Visit (HOSPITAL_COMMUNITY): Payer: Self-pay

## 2022-12-17 ENCOUNTER — Other Ambulatory Visit: Payer: Self-pay | Admitting: Physician Assistant

## 2022-12-17 MED ORDER — OZEMPIC (2 MG/DOSE) 8 MG/3ML ~~LOC~~ SOPN
2.0000 mg | PEN_INJECTOR | SUBCUTANEOUS | 2 refills | Status: DC
  Filled 2022-12-17: qty 3, 28d supply, fill #0

## 2023-01-07 ENCOUNTER — Encounter: Payer: Self-pay | Admitting: Physician Assistant

## 2023-01-07 ENCOUNTER — Ambulatory Visit (INDEPENDENT_AMBULATORY_CARE_PROVIDER_SITE_OTHER): Payer: 59 | Admitting: Physician Assistant

## 2023-01-07 ENCOUNTER — Other Ambulatory Visit (HOSPITAL_COMMUNITY): Payer: Self-pay

## 2023-01-07 VITALS — BP 130/78 | HR 94 | Temp 97.5°F | Ht 69.0 in | Wt 240.2 lb

## 2023-01-07 DIAGNOSIS — H6691 Otitis media, unspecified, right ear: Secondary | ICD-10-CM | POA: Diagnosis not present

## 2023-01-07 DIAGNOSIS — E669 Obesity, unspecified: Secondary | ICD-10-CM

## 2023-01-07 DIAGNOSIS — E1169 Type 2 diabetes mellitus with other specified complication: Secondary | ICD-10-CM | POA: Diagnosis not present

## 2023-01-07 IMAGING — MG MM DIGITAL DIAGNOSTIC UNILAT*R* W/ TOMO W/ CAD
8 series · 9 of 24 positions shown · non-contrast
Comparison: Previous exam(s).

CLINICAL DATA: Patient returns after baseline screening study for
evaluation of possible RIGHT breast distortion. No previous breast
surgery.

EXAM:
DIGITAL DIAGNOSTIC UNILATERAL RIGHT MAMMOGRAM WITH TOMOSYNTHESIS AND
CAD
TECHNIQUE: Right digital diagnostic mammography and breast tomosynthesis was
performed. The images were evaluated with computer-aided detection.

[R ML synth-2D]
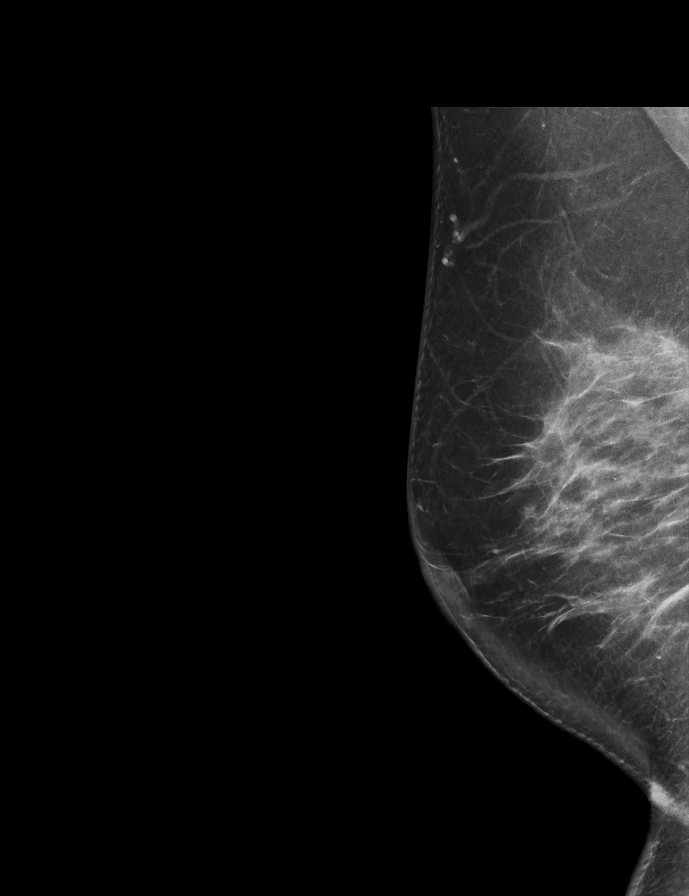

[R CC synth-2D]
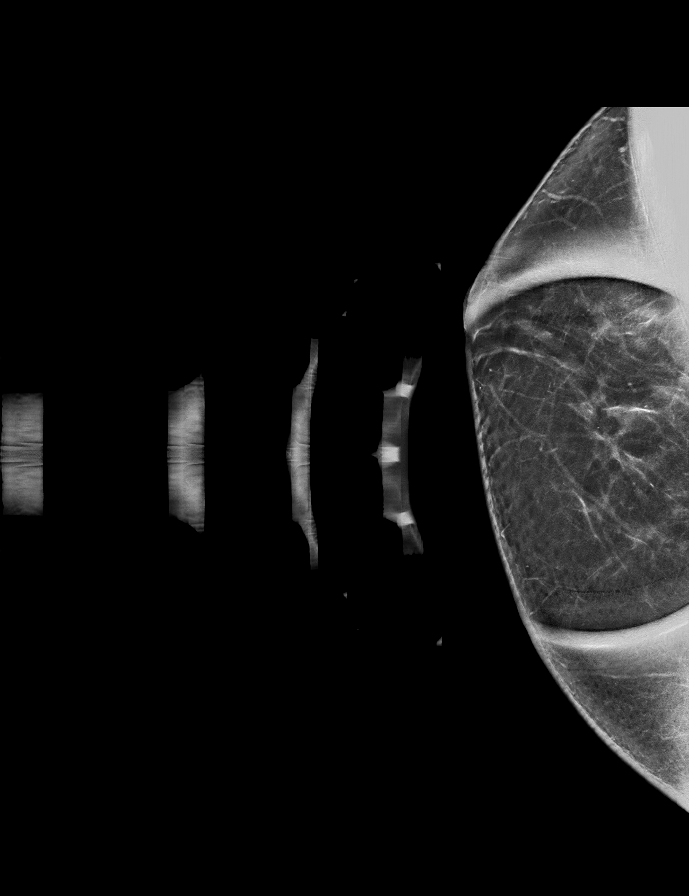

[R MLO synth-2D (1 of 2)]
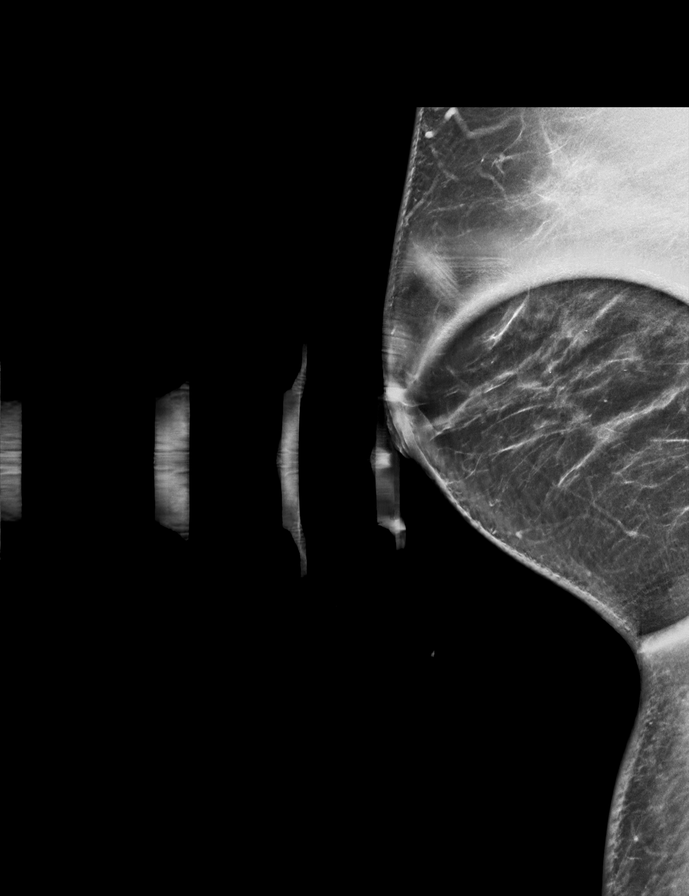

[R MLO synth-2D (2 of 2)]
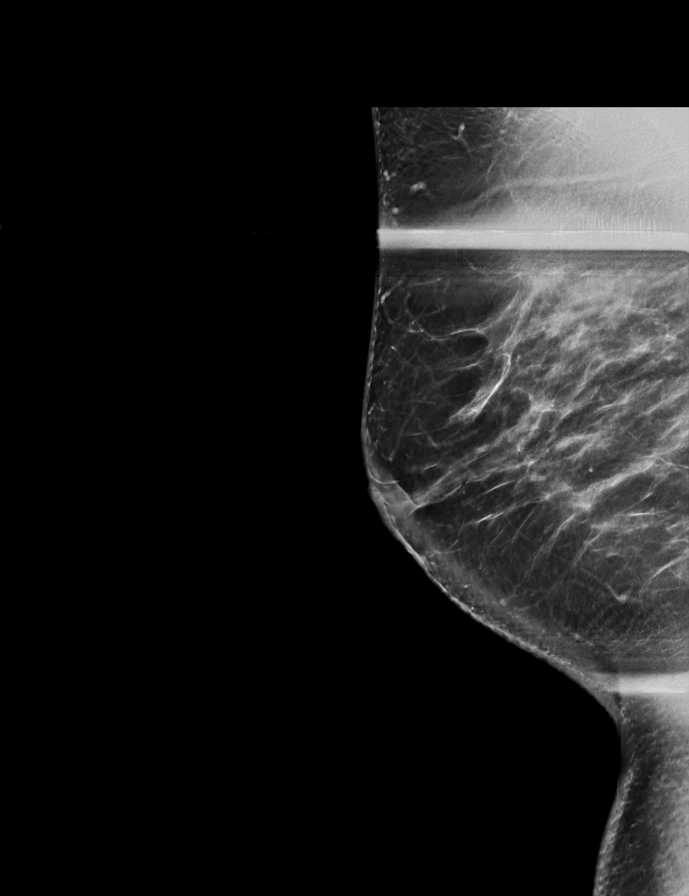

[R CC tomo · 2 of 66 frames shown]
[frame 22/66]
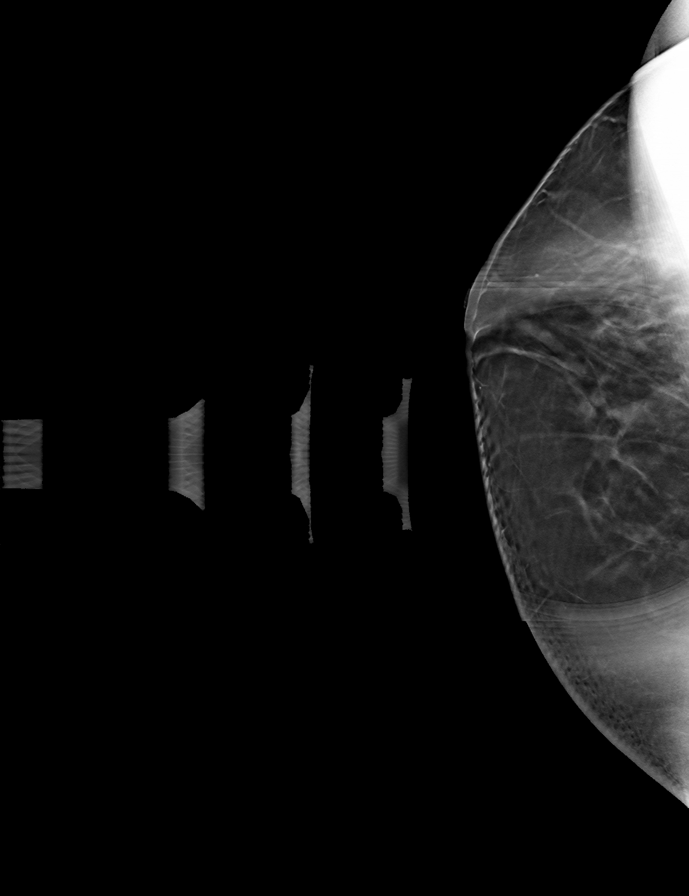
[frame 33/66]
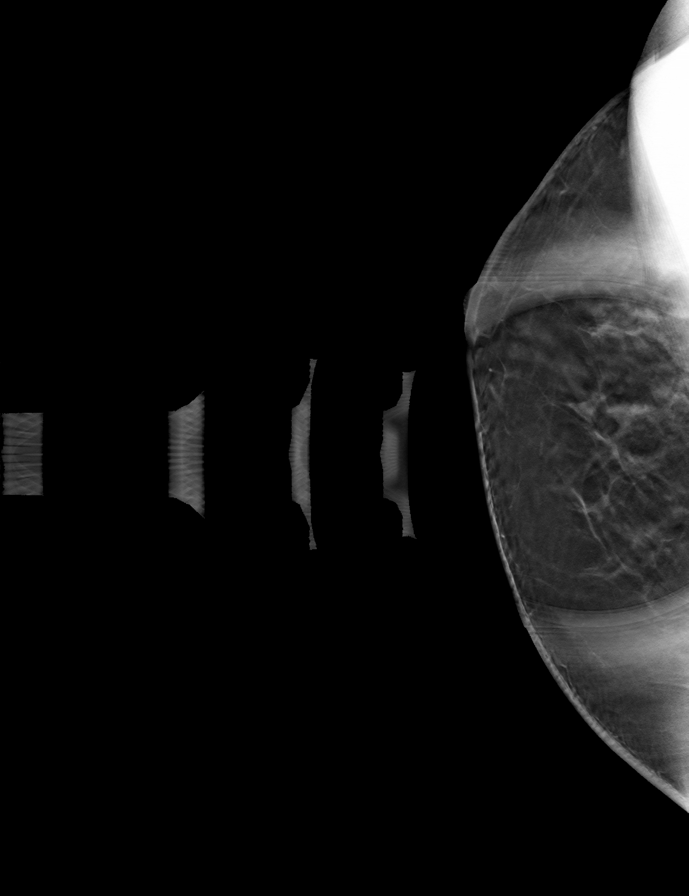

[R ML tomo · tomo slice 41/82.0]
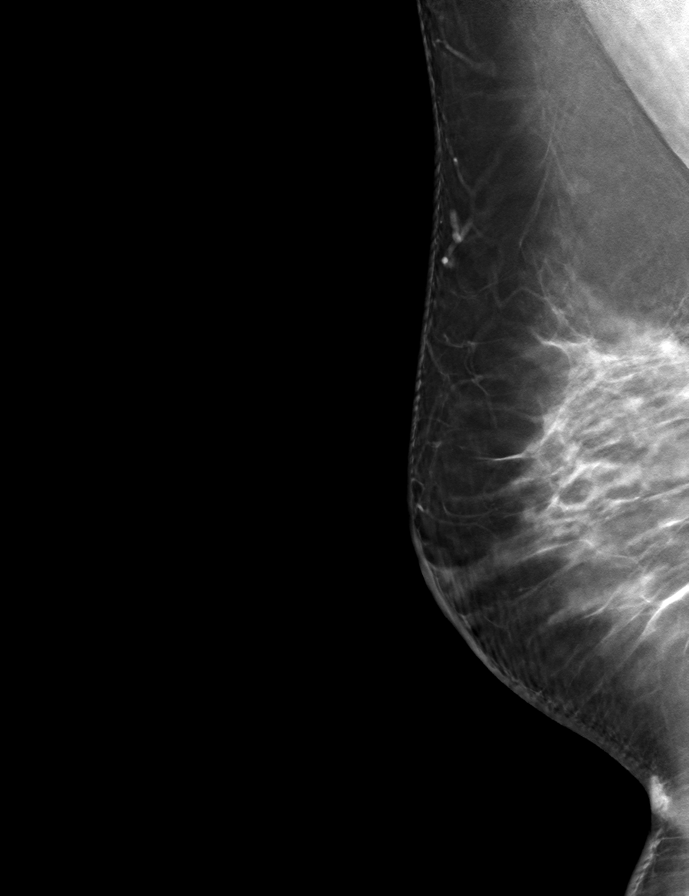

[R MLO tomo (1 of 2) · tomo slice 47/93.0]
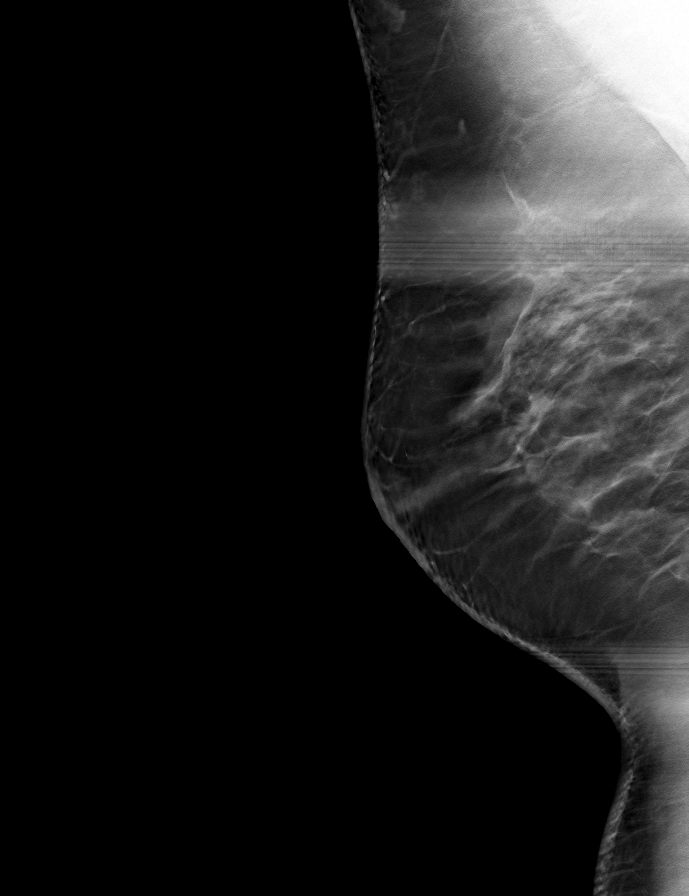

[R MLO tomo (2 of 2) · tomo slice 41/82.0]
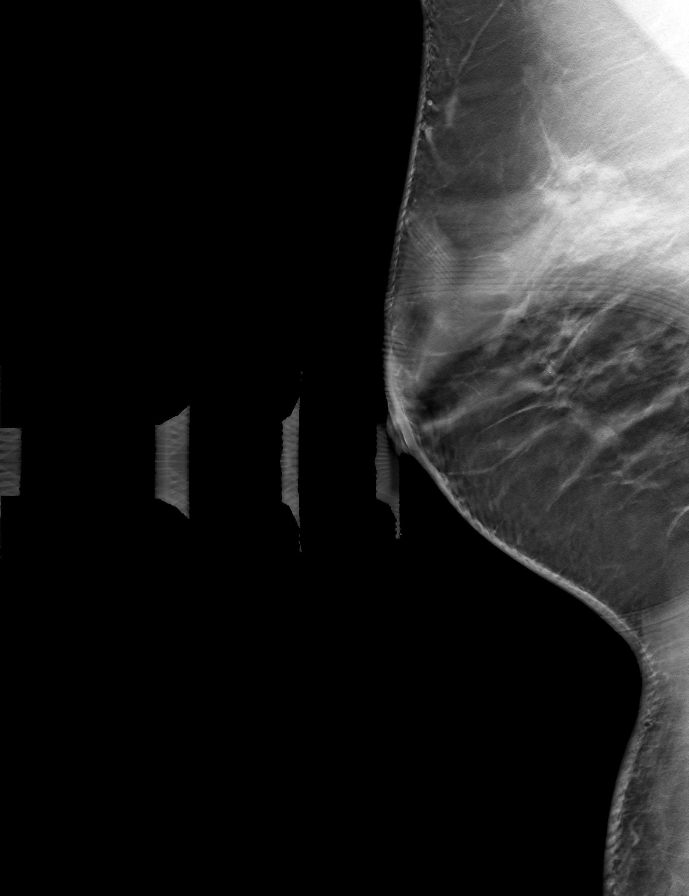

[9 of 24 positions shown; findings below may reference images not displayed]

ACR Breast Density Category c: The breast tissue is heterogeneously
dense, which may obscure small masses.
FINDINGS: Additional 2-D and 3-D images are performed. These views show no
persistent distortion in the MEDIAL portion of the RIGHT breast. No
suspicious mass, distortion, or microcalcifications are identified
to suggest presence of malignancy.
IMPRESSION: No mammographic evidence for malignancy.

RECOMMENDATION:
Screening mammogram in one year.(Code:1K-5-UX9)

I have discussed the findings and recommendations with the patient.
If applicable, a reminder letter will be sent to the patient
regarding the next appointment.

BI-RADS CATEGORY  1: Negative.

## 2023-01-07 MED ORDER — OZEMPIC (2 MG/DOSE) 8 MG/3ML ~~LOC~~ SOPN
2.0000 mg | PEN_INJECTOR | SUBCUTANEOUS | 2 refills | Status: DC
  Filled 2023-01-07: qty 3, 28d supply, fill #0
  Filled 2023-02-10: qty 3, 28d supply, fill #1
  Filled 2023-03-15: qty 3, 28d supply, fill #2

## 2023-01-07 MED ORDER — AMOXICILLIN-POT CLAVULANATE 875-125 MG PO TABS
1.0000 | ORAL_TABLET | Freq: Two times a day (BID) | ORAL | 0 refills | Status: AC
  Filled 2023-01-07: qty 14, 7d supply, fill #0

## 2023-01-07 MED ORDER — CLOBETASOL PROPIONATE 0.05 % EX SOLN
1.0000 | Freq: Two times a day (BID) | CUTANEOUS | 5 refills | Status: DC
  Filled 2023-01-07: qty 50, 25d supply, fill #0
  Filled 2023-02-10: qty 50, 25d supply, fill #1
  Filled 2023-06-08: qty 50, 25d supply, fill #2
  Filled 2023-10-27: qty 50, 25d supply, fill #3

## 2023-01-07 MED ORDER — CICLOPIROX 1 % EX SHAM
1.0000 "application " | MEDICATED_SHAMPOO | Freq: Every day | CUTANEOUS | 3 refills | Status: DC
  Filled 2023-01-07: qty 120, 30d supply, fill #0
  Filled 2023-06-08: qty 120, 30d supply, fill #1
  Filled 2023-10-27: qty 120, 30d supply, fill #2

## 2023-01-07 NOTE — Assessment & Plan Note (Signed)
Doing well with Ozempic 2 mg once weekly. Encouraged her to keep working on lifestyle goals. Recheck labs next visit.

## 2023-01-07 NOTE — Progress Notes (Signed)
Subjective:    Patient ID: Rachel Curtis, female    DOB: 14-Aug-1976, 47 y.o.   MRN: [id number]  Chief Complaint  Patient presents with   Follow-up    Pt in the office for f/u; pt doing well other than needing to look in ears due to feeling fullness and ear infection coming    HPI Patient is in today for recheck medication / weight. States Christmas season was hard, maintained weight overall though. Weight goal ultimately is 185 - 190 lbs. Staying busy with work and kids.  Feels like a R ear infection is coming on - prone to recurrence.    Past Medical History:  Diagnosis Date   Allergy    Anxiety    History of chickenpox     Past Surgical History:  Procedure Laterality Date   CHOLECYSTECTOMY  2001   INTRAUTERINE DEVICE (IUD) INSERTION     mirena inserted 10-15-21   WISDOM TOOTH EXTRACTION  1999    Family History  Problem Relation Age of Onset   Hyperlipidemia Mother    Hypertension Mother    Diabetes Father    Heart disease Father    Arthritis Father    Breast cancer Maternal Aunt    Diabetes Maternal Grandmother    Dementia Paternal Grandmother    Alcohol abuse Paternal Grandfather    Cancer Maternal Uncle        colon ca    Social History   Tobacco Use   Smoking status: Never   Smokeless tobacco: Never  Vaping Use   Vaping Use: Never used  Substance Use Topics   Alcohol use: Yes    Comment: 1 drink/month   Drug use: No     No Known Allergies  Review of Systems NEGATIVE UNLESS OTHERWISE INDICATED IN HPI      Objective:     BP 130/78 (BP Location: Left Arm)   Pulse 94   Temp (!) 97.5 F (36.4 C) (Temporal)   Ht 5\' 9"  (1.753 m)   Wt 240 lb 3.2 oz (109 kg)   SpO2 99%   BMI 35.47 kg/m   Wt Readings from Last 3 Encounters:  01/07/23 240 lb 3.2 oz (109 kg)  11/02/22 237 lb (107.5 kg)  09/24/22 243 lb 6.4 oz (110.4 kg)    BP Readings from Last 3 Encounters:  01/07/23 130/78  11/02/22 130/81  09/24/22 118/80     Physical  Exam Vitals and nursing note reviewed.  Constitutional:      Appearance: Normal appearance. She is obese.  HENT:     Right Ear: A middle ear effusion is present. Tympanic membrane is erythematous.     Left Ear: Tympanic membrane, ear canal and external ear normal.  Cardiovascular:     Rate and Rhythm: Normal rate and regular rhythm.     Pulses: Normal pulses.     Heart sounds: Normal heart sounds.  Pulmonary:     Effort: Pulmonary effort is normal.     Breath sounds: Normal breath sounds.  Neurological:     General: No focal deficit present.     Mental Status: She is alert and oriented to person, place, and time.  Psychiatric:        Mood and Affect: Mood normal.        Behavior: Behavior normal.        Assessment & Plan:  Right otitis media, unspecified otitis media type  Obesity, Class II, BMI 35-39.9 Assessment & Plan: Continue Ozempic 2 mg  once weekly. Needs to add aerobic exercise, encouraged videos / HIIT workouts at home. Ultimate goal to weigh 185-190 lbs   Type 2 diabetes mellitus with obesity ([place]) Assessment & Plan: Doing well with Ozempic 2 mg once weekly. Encouraged her to keep working on lifestyle goals. Recheck labs next visit.    Other orders -     Ciclopirox; Apply 1 application topically at bedtime.  Dispense: 120 mL; Refill: 3 -     Clobetasol Propionate; Apply 1 application topically 2 (two) times daily.  Dispense: 50 mL; Refill: 5 -     Ozempic (2 MG/DOSE); Inject 2 mg as directed once a week.  Dispense: 3 mL; Refill: 2 -     Amoxicillin-Pot Clavulanate; Take 1 tablet by mouth 2 (two) times daily for 7 days.  Dispense: 14 tablet; Refill: 0    R OM- take Augmentin as directed; recheck prn    Return in about 2 months (around 03/08/2023) for med / weight recheck .  This note was prepared with assistance of Systems analyst. Occasional wrong-word or sound-a-like substitutions may have occurred due to the inherent limitations of  voice recognition software.  Time Spent: 30 minutes of total time was spent on the date of the encounter performing the following actions: chart review prior to seeing the patient, obtaining history, performing a medically necessary exam, counseling on the treatment plan, placing orders, and documenting in our EHR.       Serria Sloma M Yemaya Barnier, PA-C

## 2023-01-07 NOTE — Assessment & Plan Note (Signed)
Continue Ozempic 2 mg once weekly. Needs to add aerobic exercise, encouraged videos / HIIT workouts at home. Ultimate goal to weigh 185-190 lbs

## 2023-03-10 ENCOUNTER — Ambulatory Visit (INDEPENDENT_AMBULATORY_CARE_PROVIDER_SITE_OTHER): Payer: 59 | Admitting: Physician Assistant

## 2023-03-10 ENCOUNTER — Encounter: Payer: Self-pay | Admitting: Physician Assistant

## 2023-03-10 VITALS — BP 128/78 | HR 88 | Temp 97.3°F | Ht 69.0 in | Wt 240.4 lb

## 2023-03-10 DIAGNOSIS — E669 Obesity, unspecified: Secondary | ICD-10-CM

## 2023-03-10 DIAGNOSIS — E1169 Type 2 diabetes mellitus with other specified complication: Secondary | ICD-10-CM | POA: Diagnosis not present

## 2023-03-10 LAB — POCT GLYCOSYLATED HEMOGLOBIN (HGB A1C): Hemoglobin A1C: 5.1 % (ref 4.0–5.6)

## 2023-03-10 NOTE — Progress Notes (Signed)
Subjective:    Patient ID: Rachel Curtis, female    DOB: [birth year], 47 y.o.   MRN: [id number]  Chief Complaint  Patient presents with   Follow-up    Pt in the office for Med check and weight check; pt is doing well was having a lot of gas but last week started going back to th gym walking a mile a day at fast pace.     HPI Patient is in today for medication and weight recheck.  Also needs follow-up on her type 2 diabetes.  No symptoms or concerns.  Taking medication as directed.  Feeling very good and energized.  She has been walking a mile at the gym every day for the last few weeks and very proud of herself to be consistent with this.  Also working on nutrition changes.  Past Medical History:  Diagnosis Date   Allergy    Anxiety    History of chickenpox     Past Surgical History:  Procedure Laterality Date   CHOLECYSTECTOMY  2001   INTRAUTERINE DEVICE (IUD) INSERTION     mirena inserted 10-15-21   WISDOM TOOTH EXTRACTION  1999    Family History  Problem Relation Age of Onset   Hyperlipidemia Mother    Hypertension Mother    Diabetes Father    Heart disease Father    Arthritis Father    Breast cancer Maternal Aunt    Diabetes Maternal Grandmother    Dementia Paternal Grandmother    Alcohol abuse Paternal Grandfather    Cancer Maternal Uncle        colon ca    Social History   Tobacco Use   Smoking status: Never   Smokeless tobacco: Never  Vaping Use   Vaping Use: Never used  Substance Use Topics   Alcohol use: Yes    Comment: 1 drink/month   Drug use: No     No Known Allergies  Review of Systems NEGATIVE UNLESS OTHERWISE INDICATED IN HPI      Objective:     BP 128/78 (BP Location: Left Arm)   Pulse 88   Temp (!) 97.3 F (36.3 C) (Temporal)   Ht 5\' 9"  (1.753 m)   Wt 240 lb 6.4 oz (109 kg)   SpO2 98%   BMI 35.50 kg/m   Wt Readings from Last 3 Encounters:  03/10/23 240 lb 6.4 oz (109 kg)  01/07/23 240 lb 3.2 oz (109 kg)  11/02/22 237  lb (107.5 kg)    BP Readings from Last 3 Encounters:  03/10/23 128/78  01/07/23 130/78  11/02/22 130/81     Physical Exam Vitals and nursing note reviewed.  Constitutional:      Appearance: Normal appearance. She is obese.  Cardiovascular:     Pulses: Normal pulses.     Heart sounds: Normal heart sounds.  Pulmonary:     Effort: Pulmonary effort is normal.     Breath sounds: Normal breath sounds.  Neurological:     General: No focal deficit present.     Mental Status: She is alert and oriented to person, place, and time.  Psychiatric:        Mood and Affect: Mood normal.        Behavior: Behavior normal.        Assessment & Plan:  Type 2 diabetes mellitus with obesity (HCC) -     POCT glycosylated hemoglobin (Hb A1C)  Obesity, Class II, BMI 35-39.9   Congratulated patient on her continued lifestyle  changes.  Her point-of-care A1c was 5.1%.  She will continue on Ozempic 2 mg once weekly.  Denies any side effects or issues.  She will call for refills as needed.  She will continue her hard work.    Return in about 2 months (around 05/10/2023) for med / wt / lifestyle recheck .  This note was prepared with assistance of Systems analyst. Occasional wrong-word or sound-a-like substitutions may have occurred due to the inherent limitations of voice recognition software.     Namish Krise M Carine Nordgren, PA-C

## 2023-03-15 ENCOUNTER — Encounter: Payer: Self-pay | Admitting: Physician Assistant

## 2023-03-16 ENCOUNTER — Other Ambulatory Visit: Payer: Self-pay | Admitting: Physician Assistant

## 2023-03-16 MED ORDER — FAMOTIDINE 40 MG PO TABS
40.0000 mg | ORAL_TABLET | Freq: Every day | ORAL | 3 refills | Status: DC
  Filled 2023-03-16: qty 90, 90d supply, fill #0
  Filled 2023-05-07 – 2023-06-24 (×2): qty 90, 90d supply, fill #1
  Filled 2023-12-19: qty 90, 90d supply, fill #2

## 2023-03-16 NOTE — Telephone Encounter (Signed)
Please see pt msg and advise if ok to send in Rx for pt

## 2023-03-17 ENCOUNTER — Other Ambulatory Visit (HOSPITAL_COMMUNITY): Payer: Self-pay

## 2023-03-17 ENCOUNTER — Other Ambulatory Visit: Payer: Self-pay

## 2023-04-06 ENCOUNTER — Other Ambulatory Visit: Payer: Self-pay | Admitting: Physician Assistant

## 2023-04-06 ENCOUNTER — Other Ambulatory Visit (HOSPITAL_BASED_OUTPATIENT_CLINIC_OR_DEPARTMENT_OTHER): Payer: Self-pay

## 2023-04-06 MED ORDER — OZEMPIC (2 MG/DOSE) 8 MG/3ML ~~LOC~~ SOPN
2.0000 mg | PEN_INJECTOR | SUBCUTANEOUS | 2 refills | Status: DC
  Filled 2023-04-06: qty 3, 28d supply, fill #0
  Filled 2023-05-07: qty 3, 28d supply, fill #1
  Filled 2023-06-08: qty 3, 28d supply, fill #2

## 2023-04-07 ENCOUNTER — Other Ambulatory Visit (HOSPITAL_BASED_OUTPATIENT_CLINIC_OR_DEPARTMENT_OTHER): Payer: Self-pay

## 2023-04-15 NOTE — Progress Notes (Deleted)
47 y.o. G25P3004 Married Caucasian female here for annual exam.    PCP:     No LMP recorded. (Menstrual status: IUD).           Sexually active: {yes no:314532}  The current method of family planning is vasectomy/IUD--Mirena 10/15/21.    Exercising: {yes no:314532}  {types:19826} Smoker:  no  Health Maintenance: Pap:  02/28/20 neg: HR HPV neg History of abnormal Pap:  no MMG:  11/06/21 Breast Density Cat C, BI-RADS CAT 1 neg Colonoscopy:  n/a BMD:   n/a  Result  n/a TDaP:  12/02/14 Gardasil:   no HIV: 06/04/19 NR Hep C: neg in the past Screening Labs:  Hb today: ***, Urine today: ***   reports that she has never smoked. She has never used smokeless tobacco. She reports current alcohol use. She reports that she does not use drugs.  Past Medical History:  Diagnosis Date   Allergy    Anxiety    History of chickenpox     Past Surgical History:  Procedure Laterality Date   CHOLECYSTECTOMY  2001   INTRAUTERINE DEVICE (IUD) INSERTION     mirena inserted 10-15-21   WISDOM TOOTH EXTRACTION  1999    Current Outpatient Medications  Medication Sig Dispense Refill   famotidine (PEPCID) 40 MG tablet Take 1 tablet (40 mg total) by mouth daily. 90 tablet 3   Ciclopirox 1 % shampoo Apply 1 application topically at bedtime. 120 mL 3   clobetasol (TEMOVATE) 0.05 % external solution Apply 1 application topically 2 (two) times daily. 50 mL 5   clotrimazole (ANTIFUNGAL, CLOTRIMAZOLE,) 1 % cream Apply 1 application topically 2 (two) times daily. 60 g 0   fluconazole (DIFLUCAN) 150 MG tablet Take 1 tablet (150 mg total) by mouth daily. 5 tablet 1   fluticasone (FLONASE) 50 MCG/ACT nasal spray Place 1 spray into both nostrils in the morning and at bedtime. 16 mL 0   levonorgestrel (MIRENA) 20 MCG/DAY IUD 1 each by Intrauterine route once.     nystatin (MYCOSTATIN/NYSTOP) powder Apply 1 application topically 3 (three) times daily. Apply to affected area for up to 7 days 30 g 4   pantoprazole  (PROTONIX) 40 MG tablet Take 1 tablet (40 mg total) by mouth daily. 30 tablet 2   Semaglutide, 2 MG/DOSE, (OZEMPIC, 2 MG/DOSE,) 8 MG/3ML SOPN Inject 2 mg as directed once a week. 3 mL 2   No current facility-administered medications for this visit.    Family History  Problem Relation Age of Onset   Hyperlipidemia Mother    Hypertension Mother    Diabetes Father    Heart disease Father    Arthritis Father    Breast cancer Maternal Aunt    Diabetes Maternal Grandmother    Dementia Paternal Grandmother    Alcohol abuse Paternal Grandfather    Cancer Maternal Uncle        colon ca    Review of Systems  Exam:   There were no vitals taken for this visit.    General appearance: alert, cooperative and appears stated age Head: normocephalic, without obvious abnormality, atraumatic Neck: no adenopathy, supple, symmetrical, trachea midline and thyroid normal to inspection and palpation Lungs: clear to auscultation bilaterally Breasts: normal appearance, no masses or tenderness, No nipple retraction or dimpling, No nipple discharge or bleeding, No axillary adenopathy Heart: regular rate and rhythm Abdomen: soft, non-tender; no masses, no organomegaly Extremities: extremities normal, atraumatic, no cyanosis or edema Skin: skin color, texture, turgor normal. No rashes  or lesions Lymph nodes: cervical, supraclavicular, and axillary nodes normal. Neurologic: grossly normal  Pelvic: External genitalia:  no lesions              No abnormal inguinal nodes palpated.              Urethra:  normal appearing urethra with no masses, tenderness or lesions              Bartholins and Skenes: normal                 Vagina: normal appearing vagina with normal color and discharge, no lesions              Cervix: no lesions              Pap taken: {yes no:314532} Bimanual Exam:  Uterus:  normal size, contour, position, consistency, mobility, non-tender              Adnexa: no mass, fullness,  tenderness              Rectal exam: {yes no:314532}.  Confirms.              Anus:  normal sphincter tone, no lesions  Chaperone was present for exam:  ***  Assessment:   Well woman visit with gynecologic exam.   Plan: Mammogram screening discussed. Self breast awareness reviewed. Pap and HR HPV as above. Guidelines for Calcium, Vitamin D, regular exercise program including cardiovascular and weight bearing exercise.   Follow up annually and prn.   Additional counseling given.  {yes [id number]. _______ minutes face to face time of which over 50% was spent in counseling.    After visit summary provided.

## 2023-04-28 ENCOUNTER — Ambulatory Visit: Payer: No Typology Code available for payment source | Admitting: Obstetrics and Gynecology

## 2023-05-06 ENCOUNTER — Ambulatory Visit (INDEPENDENT_AMBULATORY_CARE_PROVIDER_SITE_OTHER): Payer: 59 | Admitting: Physician Assistant

## 2023-05-06 VITALS — BP 132/84 | HR 94 | Temp 97.1°F | Ht 69.0 in | Wt 240.8 lb

## 2023-05-06 DIAGNOSIS — Z7985 Long-term (current) use of injectable non-insulin antidiabetic drugs: Secondary | ICD-10-CM

## 2023-05-06 DIAGNOSIS — E669 Obesity, unspecified: Secondary | ICD-10-CM

## 2023-05-06 DIAGNOSIS — E1169 Type 2 diabetes mellitus with other specified complication: Secondary | ICD-10-CM | POA: Diagnosis not present

## 2023-05-06 NOTE — Progress Notes (Signed)
Subjective:    Patient ID: Rachel Curtis, female    DOB: 16-Dec-1976, 47 y.o.   MRN: [id number]  Chief Complaint  Patient presents with   Weight Loss    Pt in office for weight loss check and follow up, pt feels like not losing anymore, still on same Ozempic dosage;     HPI Patient is in today for regular recheck. See A/P.   Past Medical History:  Diagnosis Date   Allergy    Anxiety    History of chickenpox     Past Surgical History:  Procedure Laterality Date   CHOLECYSTECTOMY  2001   INTRAUTERINE DEVICE (IUD) INSERTION     mirena inserted 10-15-21   WISDOM TOOTH EXTRACTION  1999    Family History  Problem Relation Age of Onset   Hyperlipidemia Mother    Hypertension Mother    Diabetes Father    Heart disease Father    Arthritis Father    Breast cancer Maternal Aunt    Diabetes Maternal Grandmother    Dementia Paternal Grandmother    Alcohol abuse Paternal Grandfather    Cancer Maternal Uncle        colon ca    Social History   Tobacco Use   Smoking status: Never   Smokeless tobacco: Never  Vaping Use   Vaping Use: Never used  Substance Use Topics   Alcohol use: Yes    Comment: 1 drink/month   Drug use: No     No Known Allergies  Review of Systems NEGATIVE UNLESS OTHERWISE INDICATED IN HPI      Objective:     BP 132/84 (BP Location: Left Arm)   Pulse 94   Temp (!) 97.1 F (36.2 C) (Temporal)   Ht 5\' 9"  (1.753 m)   Wt 240 lb 12.8 oz (109.2 kg)   SpO2 98%   BMI 35.56 kg/m   Wt Readings from Last 3 Encounters:  05/06/23 240 lb 12.8 oz (109.2 kg)  03/10/23 240 lb 6.4 oz (109 kg)  01/07/23 240 lb 3.2 oz (109 kg)    BP Readings from Last 3 Encounters:  05/06/23 132/84  03/10/23 128/78  01/07/23 130/78     Physical Exam Vitals and nursing note reviewed.  Constitutional:      Appearance: Normal appearance. She is obese.  Cardiovascular:     Pulses: Normal pulses.     Heart sounds: Normal heart sounds.  Pulmonary:      Effort: Pulmonary effort is normal.     Breath sounds: Normal breath sounds.  Neurological:     General: No focal deficit present.     Mental Status: She is alert and oriented to person, place, and time.  Psychiatric:        Mood and Affect: Mood normal.        Behavior: Behavior normal.        Assessment & Plan:  Type 2 diabetes mellitus with obesity ([facility]CC) Assessment & Plan: Lab Results  Component Value Date   HGBA1C 5.1 03/10/2023   Excellent control with Ozempic 2 mg once weekly.    Obesity, Class II, BMI 35-39.9 Assessment & Plan: Continue Ozempic 2 mg once weekly. Needs to add aerobic exercise, encouraged videos / HIIT workouts at home. Ultimate goal to weigh 185-190 lbs. Encouraged her to look at calorie count daily - may need to add more frequent small / high-protein meals.  Addition of pepcid has helped her indigestion with Ozempic.  Return in about 3 months (around 08/06/2023) for recheck/follow-up.     Melony Tenpas M Ashtin Melichar, PA-C

## 2023-05-06 NOTE — Assessment & Plan Note (Signed)
Lab Results  Component Value Date   HGBA1C 5.1 03/10/2023   Excellent control with Ozempic 2 mg once weekly.

## 2023-05-06 NOTE — Assessment & Plan Note (Signed)
Continue Ozempic 2 mg once weekly. Needs to add aerobic exercise, encouraged videos / HIIT workouts at home. Ultimate goal to weigh 185-190 lbs. Encouraged her to look at calorie count daily - may need to add more frequent small / high-protein meals.  Addition of pepcid has helped her indigestion with Ozempic.

## 2023-05-09 ENCOUNTER — Other Ambulatory Visit: Payer: Self-pay

## 2023-05-09 ENCOUNTER — Other Ambulatory Visit (HOSPITAL_BASED_OUTPATIENT_CLINIC_OR_DEPARTMENT_OTHER): Payer: Self-pay

## 2023-05-11 ENCOUNTER — Other Ambulatory Visit (HOSPITAL_BASED_OUTPATIENT_CLINIC_OR_DEPARTMENT_OTHER): Payer: Self-pay

## 2023-05-26 ENCOUNTER — Telehealth: Payer: Self-pay

## 2023-05-26 NOTE — Telephone Encounter (Signed)
Please advise patient on PA from 05/19/23.

## 2023-05-30 ENCOUNTER — Telehealth: Payer: Self-pay

## 2023-05-30 ENCOUNTER — Other Ambulatory Visit (HOSPITAL_COMMUNITY): Payer: Self-pay

## 2023-05-30 NOTE — Telephone Encounter (Signed)
Pharmacy Patient Advocate Encounter   Received notification that prior authorization for Ozempic 8mg /53ml is required/requested.   PA submitted to  [employer]  via telephone at [phone] [place]  # [id number] Status is pending

## 2023-06-06 ENCOUNTER — Other Ambulatory Visit (HOSPITAL_COMMUNITY): Payer: Self-pay

## 2023-06-06 NOTE — Telephone Encounter (Signed)
Patient Advocate Encounter  Received notification from [employer] that Prior Authorization for Ozempic is not needed due to it is a covered benefit.     Per [facility] test claim, copay for 28 days supply is $24.99   Determination letter indexed to chart

## 2023-06-08 ENCOUNTER — Other Ambulatory Visit (HOSPITAL_BASED_OUTPATIENT_CLINIC_OR_DEPARTMENT_OTHER): Payer: Self-pay

## 2023-07-03 ENCOUNTER — Other Ambulatory Visit: Payer: Self-pay | Admitting: Physician Assistant

## 2023-07-04 ENCOUNTER — Other Ambulatory Visit (HOSPITAL_BASED_OUTPATIENT_CLINIC_OR_DEPARTMENT_OTHER): Payer: Self-pay

## 2023-07-04 MED ORDER — OZEMPIC (2 MG/DOSE) 8 MG/3ML ~~LOC~~ SOPN
2.0000 mg | PEN_INJECTOR | SUBCUTANEOUS | 2 refills | Status: DC
  Filled 2023-07-04: qty 9, 84d supply, fill #0

## 2023-07-20 ENCOUNTER — Encounter (INDEPENDENT_AMBULATORY_CARE_PROVIDER_SITE_OTHER): Payer: Self-pay

## 2023-07-27 NOTE — Progress Notes (Signed)
47 y.o. [id number] Married Caucasian female here for annual exam.    Occasional period with her Mirena IUD. Spotting mostly, can last 4 - 5 days.   Type II diabetes.  Lost 20 - 30 pounds on Ozempic.   Wants Nystatin powder refill.  Has skin tags on her vulva.  Considering removal of one of them.   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   PCP:   Dr. Doloris Hall  No LMP recorded. (Menstrual status: IUD).           Sexually active: Yes.    The current method of family planning is IUD/vasectomy.  Mirena placed 10/15/21.   Exercising: Yes.     Cheer coach Smoker:  no  Health Maintenance: Pap:  02/28/20 neg: HR HPV neg History of abnormal Pap:  no MMG:  09-29-21 Rt.Br.poss.distortion;Lt.Br.Neg. Diag.Rt.Br.Neg/BiRads1/screening 1year  Colonoscopy:  n/a.  She will do Cologuard through PCP. BMD:   n/a  Result  n/a TDaP:  12/02/14 Gardasil:   no HIV: neg in past Hep C: neg in past Screening Labs:  PCP   reports that she has never smoked. She has never used smokeless tobacco. She reports current alcohol use. She reports that she does not use drugs.  Past Medical History:  Diagnosis Date   Allergy    Anxiety    History of chickenpox     Past Surgical History:  Procedure Laterality Date   CHOLECYSTECTOMY  2001   INTRAUTERINE DEVICE (IUD) INSERTION     mirena inserted 10-15-21   WISDOM TOOTH EXTRACTION  1999    Current Outpatient Medications  Medication Sig Dispense Refill   baclofen (LIORESAL) 10 MG tablet Take 1 tablet (10 mg total) by mouth 3 (three) times daily. 30 each 0   Ciclopirox 1 % shampoo Apply 1 application topically at bedtime. 120 mL 3   clobetasol (TEMOVATE) 0.05 % external solution Apply 1 application topically 2 (two) times daily. 50 mL 5   famotidine (PEPCID) 40 MG tablet Take 1 tablet (40 mg total) by mouth daily. 90 tablet 3   fluconazole (DIFLUCAN) 150 MG tablet Take 1 tablet (150 mg total) by mouth daily. 5 tablet 1   fluticasone (FLONASE)  50 MCG/ACT nasal spray Place 1 spray into both nostrils in the morning and at bedtime. 16 mL 0   levonorgestrel (MIRENA) 20 MCG/DAY IUD 1 each by Intrauterine route once.     nystatin (MYCOSTATIN/NYSTOP) powder Apply 1 application topically 3 (three) times daily. Apply to affected area for up to 7 days 30 g 4   Semaglutide, 2 MG/DOSE, (OZEMPIC, 2 MG/DOSE,) 8 MG/3ML SOPN Inject 2 mg into the skin once a week. 3 mL 2   No current facility-administered medications for this visit.    Family History  Problem Relation Age of Onset   Hyperlipidemia Mother    Hypertension Mother    Diabetes Father    Heart disease Father    Arthritis Father    Breast cancer Maternal Aunt    Diabetes Maternal Grandmother    Dementia Paternal Grandmother    Alcohol abuse Paternal Grandfather    Cancer Maternal Uncle        colon ca    Review of Systems  All other systems reviewed and are negative.   Exam:   BP 124/86 (BP Location: Right Arm, Patient Position: Sitting, Cuff Size: Large)   Pulse 90   Ht 5' 9.5" (1.765 m)   Wt 246 lb (111.6 kg)   SpO2  98%   BMI 35.81 kg/m     General appearance: alert, cooperative and appears stated age Head: normocephalic, without obvious abnormality, atraumatic Neck: no adenopathy, supple, symmetrical, trachea midline and thyroid normal to inspection and palpation Lungs: clear to auscultation bilaterally Breasts: normal appearance, no masses or tenderness, No nipple retraction or dimpling, No nipple discharge or bleeding, No axillary adenopathy Heart: regular rate and rhythm Abdomen: soft, non-tender; no masses, no organomegaly Extremities: extremities normal, atraumatic, no cyanosis or edema Skin: skin color, texture, turgor normal. No rashes or lesions Lymph nodes: cervical, supraclavicular, and axillary nodes normal. Neurologic: grossly normal  Pelvic: External genitalia:  3 polypoid areas of the left labia majora.  2 small and one larger.               No  abnormal inguinal nodes palpated.              Urethra:  normal appearing urethra with no masses, tenderness or lesions              Bartholins and Skenes: normal                 Vagina: normal appearing vagina with normal color and discharge, no lesions              Cervix: no lesions.  IUD strings 5 cm long, cut to 2.5 cm after verbal permission from the patient.               Pap taken: no Bimanual Exam:  Uterus:  normal size, contour, position, consistency, mobility, non-tender              Adnexa: no mass, fullness, tenderness              Rectal exam: yes.  Confirms.              Anus:  normal sphincter tone, no lesions  Chaperone was present for exam:  Warren Lacy, CMA  Assessment:   Well woman visit with gynecologic exam. Mirena IUD.  Long strings, trimmed.  Candida of flexural skin.  Left vulvar skin polypoid areas.  Type II DM.   Plan: Mammogram screening discussed.  She will update.  Self breast awareness reviewed. Pap and HR HPV 2026.  Guidelines for Calcium, Vitamin D, regular exercise program including cardiovascular and weight bearing exercise. Refill of Nystatin powder. Consider removal of left vulvar polypoid area. Follow up annually and prn.   After visit summary provided.

## 2023-08-04 ENCOUNTER — Other Ambulatory Visit (HOSPITAL_BASED_OUTPATIENT_CLINIC_OR_DEPARTMENT_OTHER): Payer: Self-pay

## 2023-08-04 ENCOUNTER — Encounter: Payer: Self-pay | Admitting: Physician Assistant

## 2023-08-04 ENCOUNTER — Ambulatory Visit (INDEPENDENT_AMBULATORY_CARE_PROVIDER_SITE_OTHER): Payer: 59 | Admitting: Physician Assistant

## 2023-08-04 VITALS — BP 128/80 | HR 88 | Temp 97.3°F | Ht 69.0 in | Wt 247.8 lb

## 2023-08-04 DIAGNOSIS — M25511 Pain in right shoulder: Secondary | ICD-10-CM

## 2023-08-04 DIAGNOSIS — Z0000 Encounter for general adult medical examination without abnormal findings: Secondary | ICD-10-CM

## 2023-08-04 MED ORDER — BACLOFEN 10 MG PO TABS
10.0000 mg | ORAL_TABLET | Freq: Three times a day (TID) | ORAL | 0 refills | Status: DC
  Filled 2023-08-04: qty 30, 10d supply, fill #0

## 2023-08-04 NOTE — Progress Notes (Signed)
Subjective:    Patient ID: Rachel Curtis, female    DOB: [birth year], 47 y.o.   MRN: [id number]  Chief Complaint  Patient presents with   Annual Exam     pt c/o right shoulder pain feeling muscle related; may be stress related.     HPI Patient is in today for annual CPE.  Acute concerns: R shoulder / neck area feels tight /painful for the last week. NKI. Did carry a heavy cooler prior to vacation.   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 Nutrition: well-balanced Mental health: work / family stress, but stable Sleep: ok Substance use: none Sexual activity: monogamous Immunizations: UTD - flu shot in fall Colonoscopy: due this year Pap: due this year with GYN    Past Medical History:  Diagnosis Date   Allergy    Anxiety    History of chickenpox     Past Surgical History:  Procedure Laterality Date   CHOLECYSTECTOMY  2001   INTRAUTERINE DEVICE (IUD) INSERTION     mirena inserted 10-15-21   WISDOM TOOTH EXTRACTION  1999    Family History  Problem Relation Age of Onset   Hyperlipidemia Mother    Hypertension Mother    Diabetes Father    Heart disease Father    Arthritis Father    Breast cancer Maternal Aunt    Diabetes Maternal Grandmother    Dementia Paternal Grandmother    Alcohol abuse Paternal Grandfather    Cancer Maternal Uncle        colon ca    Social History   Tobacco Use   Smoking status: Never   Smokeless tobacco: Never  Vaping Use   Vaping status: Never Used  Substance Use Topics   Alcohol use: Yes    Comment: 1 drink/month   Drug use: No     No Known Allergies  Review of Systems NEGATIVE UNLESS OTHERWISE INDICATED IN HPI      Objective:     BP 128/80 (BP Location: Left Arm)   Pulse 88   Temp (!) 97.3 F (36.3 C) (Temporal)   Ht 5\' 9"  (1.753 m)   Wt 247 lb 12.8 oz (112.4 kg)   SpO2 96%   BMI 36.59 kg/m   Wt Readings from Last 3 Encounters:   08/04/23 247 lb 12.8 oz (112.4 kg)  05/06/23 240 lb 12.8 oz (109.2 kg)  03/10/23 240 lb 6.4 oz (109 kg)    BP Readings from Last 3 Encounters:  08/04/23 128/80  05/06/23 132/84  03/10/23 128/78     Physical Exam Vitals and nursing note reviewed.  Constitutional:      Appearance: Normal appearance. She is normal weight. She is not toxic-appearing.  HENT:     Head: Normocephalic and atraumatic.     Right Ear: External ear normal.     Left Ear: External ear normal.     Nose: Nose normal.     Mouth/Throat:     Mouth: Mucous membranes are moist.  Eyes:     Extraocular Movements: Extraocular movements intact.     Conjunctiva/sclera: Conjunctivae normal.     Pupils: Pupils are equal, round, and reactive to light.  Cardiovascular:     Rate and Rhythm: Normal rate and regular rhythm.     Pulses: Normal pulses.     Heart sounds: Normal heart sounds.  Pulmonary:     Effort: Pulmonary effort is normal.     Breath sounds: Normal  breath sounds.  Musculoskeletal:        General: Tenderness (R trapezius diffuse, tense, tight) present. Normal range of motion.     Cervical back: Normal range of motion and neck supple.     Right lower leg: No edema.     Left lower leg: No edema.     Comments: ROM normal  Skin:    General: Skin is warm and dry.     Findings: No rash.  Neurological:     General: No focal deficit present.     Mental Status: She is alert and oriented to person, place, and time.  Psychiatric:        Mood and Affect: Mood normal.        Behavior: Behavior normal.        Thought Content: Thought content normal.        Judgment: Judgment normal.        Assessment & Plan:  Encounter for annual physical exam  Acute pain of right shoulder -     Baclofen; Take 1 tablet (10 mg total) by mouth 3 (three) times daily.  Dispense: 30 each; Refill: 0  ---Strain / spasm R shoulder / trap area - use Baclofen and Aleve BID, rest, heat / ice, massage, recheck prn     Age-appropriate screening and counseling performed today. She will come back for fasting labs. Preventive measures discussed and printed in AVS for patient.   Patient Counseling: [x]   Nutrition: Stressed importance of moderation in sodium/caffeine intake, saturated fat and cholesterol, caloric balance, sufficient intake of fresh fruits, vegetables, and fiber.  [x]   Stressed the importance of regular exercise.   []   Substance Abuse: Discussed cessation/primary prevention of tobacco, alcohol, or other drug use; driving or other dangerous activities under the influence; availability of treatment for abuse.   []   Injury prevention: Discussed safety belts, safety helmets, smoke detector, smoking near bedding or upholstery.   []   Sexuality: Discussed sexually transmitted diseases, partner selection, use of condoms, avoidance of unintended pregnancy  and contraceptive alternatives.   [x]   Dental health: Discussed importance of regular tooth brushing, flossing, and dental visits.  [x]   Health maintenance and immunizations reviewed. Please refer to Health maintenance section.       Return in about 3 months (around 11/04/2023) for recheck/follow-up.   Anayi Bricco M Rina Adney, PA-C

## 2023-08-10 ENCOUNTER — Ambulatory Visit: Payer: 59 | Admitting: Obstetrics and Gynecology

## 2023-08-10 ENCOUNTER — Encounter: Payer: Self-pay | Admitting: Obstetrics and Gynecology

## 2023-08-10 ENCOUNTER — Other Ambulatory Visit (HOSPITAL_BASED_OUTPATIENT_CLINIC_OR_DEPARTMENT_OTHER): Payer: Self-pay

## 2023-08-10 VITALS — BP 124/86 | HR 90 | Ht 69.5 in | Wt 246.0 lb

## 2023-08-10 DIAGNOSIS — Z01419 Encounter for gynecological examination (general) (routine) without abnormal findings: Secondary | ICD-10-CM

## 2023-08-10 MED ORDER — NYSTATIN 100000 UNIT/GM EX POWD
1.0000 | Freq: Three times a day (TID) | CUTANEOUS | 4 refills | Status: DC
  Filled 2023-08-10: qty 30, 10d supply, fill #0
  Filled 2023-10-27: qty 30, 10d supply, fill #1
  Filled 2024-06-19: qty 30, 10d supply, fill #2

## 2023-08-10 NOTE — Patient Instructions (Signed)

## 2023-08-19 ENCOUNTER — Encounter: Payer: Self-pay | Admitting: Physician Assistant

## 2023-08-24 ENCOUNTER — Other Ambulatory Visit: Payer: Self-pay

## 2023-08-24 DIAGNOSIS — M25511 Pain in right shoulder: Secondary | ICD-10-CM

## 2023-08-24 NOTE — Telephone Encounter (Signed)
Please see pt call response and advise if anything further needed

## 2023-08-24 NOTE — Therapy (Unsigned)
OUTPATIENT PHYSICAL THERAPY SHOULDER EVALUATION   Patient Name: Rachel Curtis MRN: [id number] DOB:[birth year], 47 y.o., female Today's Date: 08/25/2023  END OF SESSION:  PT End of Session - 08/25/23 0801     Visit Number 1    Number of Visits 16    Date for PT Re-Evaluation 10/20/23    Authorization Type aetna no auth    PT Start Time 0803    PT Stop Time 0845    PT Time Calculation (min) 42 min    Activity Tolerance Patient limited by pain    Behavior During Therapy [facility] for tasks assessed/performed             Past Medical History:  Diagnosis Date   Allergy    Anxiety    History of chickenpox    Past Surgical History:  Procedure Laterality Date   CHOLECYSTECTOMY  2001   INTRAUTERINE DEVICE (IUD) INSERTION     mirena inserted 10-15-21   WISDOM TOOTH EXTRACTION  1999   Patient Active Problem List   Diagnosis Date Noted   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   Gastroesophageal reflux disease without esophagitis 09/24/2022   Type 2 diabetes mellitus with obesity (HCC) 08/26/2022   Acute serous otitis media, recurrent, unspecified ear 05/28/2022   Acute vaginitis 03/29/2022   Allergy, unspecified, initial encounter 07/16/2021   Acute suppr otitis media w/o spon rupt ear drum, right ear 07/16/2021   Allergic rhinitis due to pollen 01/12/2018   Eustachian tube dysfunction 01/12/2018   TMJ (dislocation of temporomandibular joint) 01/12/2018   Obesity, Class II, BMI 35-39.9 04/28/2017    PCP:  Allwardt, Crist Infante, PA-C    REFERRING PROVIDER:  Allwardt, Crist Infante, PA-C    REFERRING DIAG: M25.511 (ICD-10-CM) - Acute pain of right shoulder  THERAPY DIAG:  Acute pain of right shoulder  Muscle weakness (generalized)  Rationale for Evaluation and Treatment: Rehabilitation  ONSET DATE: End of July  SUBJECTIVE:                                                                                                                                                                                       SUBJECTIVE STATEMENT: States  that she started a new job and I having more stress. States she has a lot of right shoulder pain. States she had a massage and the pain returned. States that it pulls from the back of the arm into the shoulder. 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 States tylenol takes the edge off. States she has been trying to stretch.  Hand dominance: Right  PERTINENT HISTORY: DB  PAIN:  Are you having pain?  Yes: NPRS scale: 6, at worst 8 /10 Pain location: right shoulder Pain description: dull, achy, sharp shooting. Aggravating factors: sneezing, coughing, moving, arm hanging down by her side Relieving factors: medication, stillness, massage  PRECAUTIONS: None  RED FLAGS: None   WEIGHT BEARING RESTRICTIONS: No  FALLS:  Has patient fallen in last 6 months? 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  PLOF: Independent  PATIENT GOALS:to get less pain  NEXT MD VISIT:   OBJECTIVE:   DIAGNOSTIC FINDINGS:  None at this time   COGNITION: Overall cognitive status: Within functional limits for tasks assessed     SENSATION: Not tested  POSTURE: Rounded shoulders, forward head, sacral sitting, increased thoracic kyphosis   Cerivcal  A/PROM:    EVAL     Flexion 25*( sharp)      Extension 20*      R ROT  50 (pulling)     L ROT  50 (pulling )     R SB  15    L SB 5*     * Pain   (Blank rows = not tested)    UE Measurements Upper Extremity Right EVAL Left EVAL   A/PROM MMT A/PROM MMT  Shoulder Flexion 160*(VE) 4 170 4+  Shoulder Extension      Shoulder Abduction  4  4+  Shoulder Adduction      Shoulder Internal Rotation Reaches to T1 SP (favors abd) 4+ Reaches to T2 SP  4+  Shoulder External Rotation Reaches to T12 SP* 4- Reaches to T8 SP 4-  Elbow Flexion      Elbow Extension      Wrist Flexion      Wrist Extension      Wrist Supination      Wrist Pronation       Wrist Ulnar Deviation      Wrist Radial Deviation      Grip Strength NA  NA     (Blank rows = not tested)   * pain   SPECIAL TESTS: Neg spurlings  JOINT MOBILITY TESTING:   Hypomobility noted in cervical and thoracic spine- reduced pain noted with PA to upper thoracic spine  PALPATION:  Tenderness to palpation along R UT and rhomboids, increased resting tone in B shoulder regions and neck R>L   TODAY'S TREATMENT:                                                                                                                                         DATE:  08/25/2023  Therapeutic Exercise:  Aerobic: Supine:towel roll down spine 3 minutes  Prone:  Seated:  Standing: Neuromuscular Re-education: on ergonomic set up, posture, tall sitting - 8 minutes, long exhale breathing in supine and seated 6 minutes - rationale for breathing exercises Manual Therapy: cervical traction 3 minutes Therapeutic Activity: Self Care: Trigger Point Dry Needling:  Modalities:  PATIENT EDUCATION:  Education details: on current presentation, on HEP, on clinical outcomes score and POC, on benefits and safe use of inflatable cervical traction device, on posture and set up at work to reduce stress and support under right arm.  On not using purse on right arm. Person educated: Patient Education method: Explanation, Demonstration, and Handouts and prior demonstration Education comprehension: verbalized understanding   HOME EXERCISE PROGRAM: No medbridge at this time  ASSESSMENT:  CLINICAL IMPRESSION: Patient presents with acute right shoulder and neck pain that started a couple months ago after starting new job.  Pain is worse with arm in dependent position and when sitting.  Educated patient current presentation as well as symptoms coming from thoracic/cervical spine.  Tolerated session very well reporting reduced pain with mobilization of spine as well as with positional relief strategies.  Educated  patient and importance of ergonomic set up at work and encouraged patient to take pictures to help improve set up to reduce strain on neck.  Overall patient would greatly benefit from skilled PT to reduce pain and improve overall quality of life.  OBJECTIVE IMPAIRMENTS: decreased activity tolerance, difficulty walking, decreased ROM, decreased strength, impaired UE functional use, improper body mechanics, postural dysfunction, and pain.   ACTIVITY LIMITATIONS: carrying, lifting, sitting, standing, sleeping, reach over head, and locomotion level  PARTICIPATION LIMITATIONS: meal prep, cleaning, driving, community activity, occupation, and participating in Scientist, physiological  PERSONAL FACTORS: Fitness and Profession are also affecting patient's functional outcome.   REHAB POTENTIAL: Good  CLINICAL DECISION MAKING: Stable/uncomplicated  EVALUATION COMPLEXITY: Low   GOALS: Goals reviewed with patient? yes  SHORT TERM GOALS: Target date: 09/22/2023 Patient will be independent in self management strategies to improve quality of life and functional outcomes. Baseline: New Program Goal status: INITIAL  2.  Patient will report at least 50% improvement in overall symptoms and/or function to demonstrate improved functional mobility Baseline: 0% better Goal status: INITIAL  3.  Patient will report improved ergonomic set up at work reducing stress on neck and back. Baseline: Not currently Goal status: INITIAL       LONG TERM GOALS: Target date: 10/20/2023  Patient will report at least 75% improvement in overall symptoms and/or function to demonstrate improved functional mobility Baseline: 0% better Goal status: INITIAL  2.  Patient will demonstrate pain-free range of motion in shoulder and neck to return patient to prior level of function. Baseline: Painful Goal status: INITIAL  3.  Patient will report no symptoms in right upper extremity no matter positioning of arm to improve quality  of life Baseline: Painful and dependent position and reaching overhead Goal status: INITIAL    PLAN:  PT FREQUENCY: 2x/week  PT DURATION: 8 weeks  PLANNED INTERVENTIONS: Therapeutic exercises, Therapeutic activity, Neuromuscular re-education, Balance training, Gait training, Patient/Family education, Self Care, Joint mobilization, Joint manipulation, Stair training, Vestibular training, Canalith repositioning, Orthotic/Fit training, Prosthetic training, DME instructions, Aquatic Therapy, Dry Needling, Electrical stimulation, Spinal manipulation, Spinal mobilization, Cryotherapy, Moist heat, Taping, Traction, Ultrasound, Ionotophoresis 4mg /ml Dexamethasone, Manual therapy, and Re-evaluation.   PLAN FOR NEXT SESSION: Thoracic and cervical mobilization, STM as indicated, thoracic openers and scapular protraction, chin tucks, pain management interventions as needed including cupping/dry needling/scraping/heat   11:16 AM, 08/25/23 Tereasa Coop, DPT Physical Therapy with [facility]

## 2023-08-24 NOTE — Telephone Encounter (Signed)
Referral per PCP recommendations

## 2023-08-24 NOTE — Telephone Encounter (Signed)
Patient called for scheduling of PT. Patient will need a referral prior to scheduling. I informed patient of this and she verbalized understanding. I informed patient I would make PCP aware and request referral be placed so that we can get her scheduled. I informed patient I am unable to get her scheduled without having the referral to attach it to. Patient verbalized understanding but went on to ask when next availability would be for PT and I informed her Monday.   Patient stated she can't wait that long so she would figure something else out. She said not to worry about moving forward with this.

## 2023-08-25 ENCOUNTER — Encounter: Payer: Self-pay | Admitting: Physical Therapy

## 2023-08-25 ENCOUNTER — Ambulatory Visit: Payer: 59 | Admitting: Physical Therapy

## 2023-08-25 DIAGNOSIS — M6281 Muscle weakness (generalized): Secondary | ICD-10-CM | POA: Diagnosis not present

## 2023-08-25 DIAGNOSIS — M25511 Pain in right shoulder: Secondary | ICD-10-CM

## 2023-08-29 ENCOUNTER — Ambulatory Visit: Payer: 59 | Admitting: Physical Therapy

## 2023-08-29 ENCOUNTER — Encounter: Payer: Self-pay | Admitting: Physical Therapy

## 2023-08-29 DIAGNOSIS — M6281 Muscle weakness (generalized): Secondary | ICD-10-CM | POA: Diagnosis not present

## 2023-08-29 DIAGNOSIS — M25511 Pain in right shoulder: Secondary | ICD-10-CM | POA: Diagnosis not present

## 2023-08-29 NOTE — Therapy (Addendum)
 OUTPATIENT PHYSICAL THERAPY SHOULDER TREATMENT PHYSICAL THERAPY DISCHARGE SUMMARY  Visits from Start of Care: 2  Current functional level related to goals / functional outcomes: Could not reassess due to unplanned discharged    Remaining deficits: Could not reassess due to unplanned discharged    Education / Equipment: Could not reassess due to unplanned discharged    Patient agrees to discharge. Patient goals were not met. Patient is being discharged due to being pleased with the current functional level.  10:04 AM, 02/29/24 Tereasa Coop, DPT Physical Therapy with [facility]    Patient Name: Rachel Curtis MRN: [id number] DOB:[birth year], 47 y.o., female Today's Date: 08/29/2023  END OF SESSION:  PT End of Session - 08/29/23 1432     Visit Number 2    Number of Visits 16    Date for PT Re-Evaluation 10/20/23    Authorization Type aetna no auth    PT Start Time 1432    PT Stop Time 1520    PT Time Calculation (min) 48 min    Activity Tolerance Patient limited by pain    Behavior During Therapy [facility] for tasks assessed/performed             Past Medical History:  Diagnosis Date   Allergy    Anxiety    History of chickenpox    Past Surgical History:  Procedure Laterality Date   CHOLECYSTECTOMY  2001   INTRAUTERINE DEVICE (IUD) INSERTION     mirena inserted 10-15-21   WISDOM TOOTH EXTRACTION  1999   Patient Active Problem List   Diagnosis Date Noted   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   Gastroesophageal reflux disease without esophagitis 09/24/2022   Type 2 diabetes mellitus with obesity (HCC) 08/26/2022   Acute serous otitis media, recurrent, unspecified ear 05/28/2022   Acute vaginitis 03/29/2022   Allergy, unspecified, initial encounter 07/16/2021   Acute suppr otitis media w/o spon rupt ear drum, right ear 07/16/2021   Allergic rhinitis due to pollen 01/12/2018   Eustachian tube dysfunction 01/12/2018   TMJ (dislocation of temporomandibular joint)  01/12/2018   Obesity, Class II, BMI 35-39.9 04/28/2017    PCP:  Allwardt, Crist Infante, PA-C    REFERRING PROVIDER:  Allwardt, Crist Infante, PA-C    REFERRING DIAG: M25.511 (ICD-10-CM) - Acute pain of right shoulder  THERAPY DIAG:  Acute pain of right shoulder  Muscle weakness (generalized)  Rationale for Evaluation and Treatment: Rehabilitation  ONSET DATE: End of July  SUBJECTIVE:  SUBJECTIVE STATEMENT: 08/29/2023 Sharp pains are less but with her breathing exercises she is sitting up she gets sharp migraine pain. States the breathing seems to help. Positional support at work has helped.    EVAL: States  that she started a new job and I having more stress. States she has a lot of right shoulder pain. States she had a massage and the pain returned. States that it pulls from the back of the arm into the shoulder. 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 States tylenol takes the edge off. States she has been trying to stretch.  Hand dominance: Right  PERTINENT HISTORY: DB  PAIN:  Are you having pain? Yes: NPRS scale: 1 now but sharp bouts 7/10 Pain location: right shoulder and neck/head Pain description: dull, achy, sharp shooting. Aggravating factors: sneezing, coughing, moving, arm hanging down by her side Relieving factors: medication, stillness, massage  PRECAUTIONS: None  RED FLAGS: None   WEIGHT BEARING RESTRICTIONS: No  FALLS:  Has patient fallen in last 6 months? No    OCCUPATION: Practice leader at [employer]  PLOF: Independent  PATIENT GOALS:to get less pain  NEXT MD VISIT:   OBJECTIVE:   DIAGNOSTIC FINDINGS:  None at this time   COGNITION: Overall cognitive status: Within functional limits for tasks  assessed     SENSATION: Not tested  POSTURE: Rounded shoulders, forward head, sacral sitting, increased thoracic kyphosis   Cerivcal  A/PROM:    EVAL     Flexion 25*( sharp)      Extension 20*      R ROT  50 (pulling)     L ROT  50 (pulling )     R SB  15    L SB 5*     * Pain   (Blank rows = not tested)    UE Measurements Upper Extremity Right EVAL Left EVAL   A/PROM MMT A/PROM MMT  Shoulder Flexion 160*(VE) 4 170 4+  Shoulder Extension      Shoulder Abduction  4  4+  Shoulder Adduction      Shoulder Internal Rotation Reaches to T1 SP (favors abd) 4+ Reaches to T2 SP  4+  Shoulder External Rotation Reaches to T12 SP* 4- Reaches to T8 SP 4-  Elbow Flexion      Elbow Extension      Wrist Flexion      Wrist Extension      Wrist Supination      Wrist Pronation      Wrist Ulnar Deviation      Wrist Radial Deviation      Grip Strength NA  NA     (Blank rows = not tested)   * pain   SPECIAL TESTS: Neg spurlings  JOINT MOBILITY TESTING:   Hypomobility noted in cervical and thoracic spine- reduced pain noted with PA to upper thoracic spine  PALPATION:  Tenderness to palpation along R UT and rhomboids, increased resting tone in B shoulder regions and neck R>L   TODAY'S TREATMENT:  DATE:  08/29/2023  Therapeutic Exercise:  Aerobic: Supine:towel roll down spine reviewed, chin tucks 5 minutes Prone: scap retraction 5 minutes with PT assist initially  Seated: scapular retraction 5 minutes, chin tucks 5 minutes  Standing: shoulder flexion up wall with pillow case - pressing out - tolerated well x5 5" holds Neuromuscular Re-education:   Manual Therapy: cervical traction 3 minutes, STM to suboccipitals, levator, UT, thoracic paraspinals PA to thoracic spine/ribs -grade II/III - tolerated well. STM to supraspinatus Therapeutic  Activity: Self Care: Trigger Point Dry Needling:  Modalities:    PATIENT EDUCATION:  Education details: on  HEP and anatomy Person educated: Patient Education method: Explanation, Demonstration, and Handouts and prior demonstration Education comprehension: verbalized understanding   HOME EXERCISE PROGRAM: [email or web address]  ASSESSMENT:  CLINICAL IMPRESSION: 08/29/2023 Rationale behind interventions and tolerated session well. No increase in pain just muscular fatigue and soreness. Reduced heaviness sin arm with scapular retraction exercises. Answered all questions and overall patient doing well. Will continue to benefit from skilled PT at this time.   Eval: Patient presents with acute right shoulder and neck pain that started a couple months ago after starting new job.  Pain is worse with arm in dependent position and when sitting.  Educated patient current presentation as well as symptoms coming from thoracic/cervical spine.  Tolerated session very well reporting reduced pain with mobilization of spine as well as with positional relief strategies.  Educated patient and importance of ergonomic set up at work and encouraged patient to take pictures to help improve set up to reduce strain on neck.  Overall patient would greatly benefit from skilled PT to reduce pain and improve overall quality of life.  OBJECTIVE IMPAIRMENTS: decreased activity tolerance, difficulty walking, decreased ROM, decreased strength, impaired UE functional use, improper body mechanics, postural dysfunction, and pain.   ACTIVITY LIMITATIONS: carrying, lifting, sitting, standing, sleeping, reach over head, and locomotion level  PARTICIPATION LIMITATIONS: meal prep, cleaning, driving, community activity, occupation, and participating in Scientist, physiological  PERSONAL FACTORS: Fitness and Profession are also affecting patient's functional outcome.   REHAB POTENTIAL: Good  CLINICAL DECISION MAKING:  Stable/uncomplicated  EVALUATION COMPLEXITY: Low   GOALS: Goals reviewed with patient? yes  SHORT TERM GOALS: Target date: 09/22/2023 Patient will be independent in self management strategies to improve quality of life and functional outcomes. Baseline: New Program Goal status: INITIAL  2.  Patient will report at least 50% improvement in overall symptoms and/or function to demonstrate improved functional mobility Baseline: 0% better Goal status: INITIAL  3.  Patient will report improved ergonomic set up at work reducing stress on neck and back. Baseline: Not currently Goal status: INITIAL       LONG TERM GOALS: Target date: 10/20/2023  Patient will report at least 75% improvement in overall symptoms and/or function to demonstrate improved functional mobility Baseline: 0% better Goal status: INITIAL  2.  Patient will demonstrate pain-free range of motion in shoulder and neck to return patient to prior level of function. Baseline: Painful Goal status: INITIAL  3.  Patient will report no symptoms in right upper extremity no matter positioning of arm to improve quality of life Baseline: Painful and dependent position and reaching overhead Goal status: INITIAL    PLAN:  PT FREQUENCY: 2x/week  PT DURATION: 8 weeks  PLANNED INTERVENTIONS: Therapeutic exercises, Therapeutic activity, Neuromuscular re-education, Balance training, Gait training, Patient/Family education, Self Care, Joint mobilization, Joint manipulation, Stair training, Vestibular training, Canalith repositioning, Orthotic/Fit training, Prosthetic training, DME instructions, Aquatic Therapy, Dry  Needling, Electrical stimulation, Spinal manipulation, Spinal mobilization, Cryotherapy, Moist heat, Taping, Traction, Ultrasound, Ionotophoresis 4mg /ml Dexamethasone, Manual therapy, and Re-evaluation.   PLAN FOR NEXT SESSION: Thoracic and cervical mobilization, STM as indicated, thoracic openers and scapular  protraction, chin tucks, pain management interventions as needed including cupping/dry needling/scraping/heat   3:45 PM, 08/29/23 Tereasa Coop, DPT Physical Therapy with [facility]

## 2023-08-31 ENCOUNTER — Encounter: Payer: 59 | Admitting: Physical Therapy

## 2023-09-05 ENCOUNTER — Encounter: Payer: 59 | Admitting: Physical Therapy

## 2023-09-08 ENCOUNTER — Encounter: Payer: 59 | Admitting: Physical Therapy

## 2023-09-12 ENCOUNTER — Telehealth: Payer: Self-pay | Admitting: Physical Therapy

## 2023-09-12 ENCOUNTER — Encounter: Payer: 59 | Admitting: Physical Therapy

## 2023-09-12 NOTE — Telephone Encounter (Signed)
Called patient about cancellation due to patient feeling better. Patient overall having significantly less pain but continues to have some pulling sensation in her shoulder. Patient would like to be placed on hold at this time to see if current interventions help and symptoms resolve.   If patient does not return in 4 weeks (10/10/23). Will formally discharge patient at that time.  9:29 AM, 09/12/23 Tereasa Coop, DPT Physical Therapy with [facility]

## 2023-09-15 ENCOUNTER — Encounter: Payer: 59 | Admitting: Physical Therapy

## 2023-11-08 DIAGNOSIS — H524 Presbyopia: Secondary | ICD-10-CM | POA: Diagnosis not present

## 2023-11-08 DIAGNOSIS — Z79899 Other long term (current) drug therapy: Secondary | ICD-10-CM | POA: Diagnosis not present

## 2023-11-08 DIAGNOSIS — E119 Type 2 diabetes mellitus without complications: Secondary | ICD-10-CM | POA: Diagnosis not present

## 2023-11-08 LAB — HM DIABETES EYE EXAM

## 2023-11-11 ENCOUNTER — Ambulatory Visit: Payer: 59 | Admitting: Physician Assistant

## 2023-11-11 ENCOUNTER — Encounter: Payer: Self-pay | Admitting: Physician Assistant

## 2023-12-06 ENCOUNTER — Ambulatory Visit (INDEPENDENT_AMBULATORY_CARE_PROVIDER_SITE_OTHER): Payer: 59 | Admitting: Physician Assistant

## 2023-12-06 ENCOUNTER — Other Ambulatory Visit (HOSPITAL_BASED_OUTPATIENT_CLINIC_OR_DEPARTMENT_OTHER): Payer: Self-pay

## 2023-12-06 VITALS — BP 130/82 | HR 84 | Temp 97.5°F | Ht 69.5 in | Wt 264.0 lb

## 2023-12-06 DIAGNOSIS — G479 Sleep disorder, unspecified: Secondary | ICD-10-CM

## 2023-12-06 DIAGNOSIS — M2550 Pain in unspecified joint: Secondary | ICD-10-CM

## 2023-12-06 DIAGNOSIS — F419 Anxiety disorder, unspecified: Secondary | ICD-10-CM | POA: Diagnosis not present

## 2023-12-06 DIAGNOSIS — E1169 Type 2 diabetes mellitus with other specified complication: Secondary | ICD-10-CM | POA: Diagnosis not present

## 2023-12-06 DIAGNOSIS — E669 Obesity, unspecified: Secondary | ICD-10-CM | POA: Diagnosis not present

## 2023-12-06 DIAGNOSIS — R21 Rash and other nonspecific skin eruption: Secondary | ICD-10-CM | POA: Diagnosis not present

## 2023-12-06 DIAGNOSIS — Z6838 Body mass index (BMI) 38.0-38.9, adult: Secondary | ICD-10-CM | POA: Diagnosis not present

## 2023-12-06 DIAGNOSIS — Z7985 Long-term (current) use of injectable non-insulin antidiabetic drugs: Secondary | ICD-10-CM

## 2023-12-06 LAB — POCT GLYCOSYLATED HEMOGLOBIN (HGB A1C): Hemoglobin A1C: 5.7 % — AB (ref 4.0–5.6)

## 2023-12-06 MED ORDER — MELOXICAM 15 MG PO TABS
15.0000 mg | ORAL_TABLET | Freq: Every day | ORAL | 0 refills | Status: DC
Start: 2023-12-06 — End: 2024-05-24
  Filled 2023-12-06: qty 30, 30d supply, fill #0

## 2023-12-06 MED ORDER — BACLOFEN 10 MG PO TABS
10.0000 mg | ORAL_TABLET | Freq: Three times a day (TID) | ORAL | 0 refills | Status: DC | PRN
  Filled 2023-12-06: qty 30, 10d supply, fill #0

## 2023-12-06 MED ORDER — TIRZEPATIDE 5 MG/0.5ML ~~LOC~~ SOAJ
5.0000 mg | SUBCUTANEOUS | 0 refills | Status: DC
Start: 2023-12-06 — End: 2023-12-29
  Filled 2023-12-06: qty 2, 28d supply, fill #0

## 2023-12-06 MED ORDER — TRIAMCINOLONE ACETONIDE 0.5 % EX CREA
1.0000 | TOPICAL_CREAM | Freq: Three times a day (TID) | CUTANEOUS | 0 refills | Status: AC
  Filled 2023-12-06: qty 60, 20d supply, fill #0

## 2023-12-06 NOTE — Progress Notes (Signed)
Patient ID: Rachel Curtis, female    DOB: 13-Sep-1976, 47 y.o.   MRN: [id number]   Assessment & Plan:  Anxiety -     Baclofen; Take 1 tablet (10 mg total) by mouth 3 (three) times daily as needed for muscle spasms.  Dispense: 30 each; Refill: 0  Type 2 diabetes mellitus with obesity (HCC) -     POCT glycosylated hemoglobin (Hb A1C) -     Tirzepatide; Inject 5 mg into the skin once a week.  Dispense: 6 mL; Refill: 0  Difficulty sleeping -     Baclofen; Take 1 tablet (10 mg total) by mouth 3 (three) times daily as needed for muscle spasms.  Dispense: 30 each; Refill: 0  Arthralgia, unspecified joint -     Baclofen; Take 1 tablet (10 mg total) by mouth 3 (three) times daily as needed for muscle spasms.  Dispense: 30 each; Refill: 0 -     Meloxicam; Take 1 tablet (15 mg total) by mouth daily. Take with food.  Dispense: 30 tablet; Refill: 0  Rash and nonspecific skin eruption -     Triamcinolone Acetonide; Apply 1 Application topically 3 (three) times daily. Use for no longer than two weeks consistently, then sparingly.  Dispense: 60 g; Refill: 0   Assessment and Plan    Arthralgia Bilateral joint pain in wrists, shoulders, knees, and ankles. Described as achy and stiff, exacerbated by cold weather and possibly menstrual cycle. No mention of redness or swelling. -Start Meloxicam for anti-inflammatory effect.  Insomnia Difficulty sleeping, possibly related to stress and menstrual cycle. Currently using Tylenol PM. -Start Baclofen for muscle relaxation and to aid sleep.  Type 2 Diabetes Patient reports Ozempic is not curbing appetite as effectively as before and is causing fatigue. Patient also admits to missing doses. -Discontinue Ozempic. -Start Mounjaro (semaglutide) injection. -Keep working on lifestyle  Lab Results  Component Value Date   HGBA1C 5.7 (A) 12/06/2023   HGBA1C 5.1 03/10/2023   HGBA1C 5.3 08/26/2022     Eczema Flare-ups on neck and scalp, possibly  stress-related. Currently using over-the-counter cortisone cream. -Prescribe Kenalog cream for use up to two weeks.  Follow-up in two months (February 2025) to assess response to new medications and check labs.           Return in about 2 months (around 02/06/2024) for recheck/follow-up.    Subjective:    Chief Complaint  Patient presents with   Medical Management of Chronic Issues    Pt in office for 3 mon f/u; pt c/o really bad joint pain all over body mainly wrist, shoulder, knees and ankles; not sure if weather related; discuss adjustments to Ozempic.      HPI Discussed the use of AI scribe software for clinical note transcription with the patient, who gave verbal consent to proceed.  History of Present Illness   Rachel Curtis, a patient with a history of diabetes and eczema, presents with increased joint pain, which she believes is due to arthritis. The pain is bilateral and affects her wrists, shoulders, knees, and ankles. She describes the pain as achy and stiff, which is more noticeable when she is at rest. The pain has been more pronounced with the recent change in weather.  Rachel Curtis also reports having trouble sleeping, which she attributes to stress and her menstrual cycle. She mentions feeling constantly tired and drained, which she believes is due to her diabetes medication, Ozempic. She reports that the medication is not curbing her appetite as  effectively as it used to.  In addition to these issues, Rachel Curtis is experiencing eczema flare-ups, which she believes are stress-induced. The flare-ups occur on her scalp and neck and are not painful or itchy.       Past Medical History:  Diagnosis Date   Allergy    Anxiety    History of chickenpox     Past Surgical History:  Procedure Laterality Date   CHOLECYSTECTOMY  2001   INTRAUTERINE DEVICE (IUD) INSERTION     mirena inserted 10-15-21   WISDOM TOOTH EXTRACTION  1999    Family History  Problem Relation Age of Onset    Hyperlipidemia Mother    Hypertension Mother    Diabetes Father    Heart disease Father    Arthritis Father    Breast cancer Maternal Aunt    Diabetes Maternal Grandmother    Dementia Paternal Grandmother    Alcohol abuse Paternal Grandfather    Cancer Maternal Uncle        colon ca    Social History   Tobacco Use   Smoking status: Never   Smokeless tobacco: Never  Vaping Use   Vaping status: Never Used  Substance Use Topics   Alcohol use: Yes    Comment: 1 drink/month   Drug use: No     No Known Allergies  Review of Systems NEGATIVE UNLESS OTHERWISE INDICATED IN HPI      Objective:     BP 130/82 (BP Location: Left Arm, Patient Position: Sitting, Cuff Size: Normal)   Pulse 84   Temp (!) 97.5 F (36.4 C) (Temporal)   Ht 5' 9.5" (1.765 m)   Wt 264 lb (119.7 kg)   SpO2 98%   BMI 38.43 kg/m   Wt Readings from Last 3 Encounters:  12/06/23 264 lb (119.7 kg)  08/10/23 246 lb (111.6 kg)  08/04/23 247 lb 12.8 oz (112.4 kg)    BP Readings from Last 3 Encounters:  12/06/23 130/82  08/10/23 124/86  08/04/23 128/80     Physical Exam Vitals and nursing note reviewed.  Constitutional:      Appearance: Normal appearance. She is obese.  Eyes:     Extraocular Movements: Extraocular movements intact.     Conjunctiva/sclera: Conjunctivae normal.     Pupils: Pupils are equal, round, and reactive to light.  Cardiovascular:     Rate and Rhythm: Normal rate and regular rhythm.     Pulses: Normal pulses.     Heart sounds: Normal heart sounds. No murmur heard. Pulmonary:     Effort: Pulmonary effort is normal.     Breath sounds: Normal breath sounds.  Skin:    Findings: Rash (R side of neck eczema noted) present.  Neurological:     Mental Status: She is alert.  Psychiatric:        Mood and Affect: Mood normal.        Behavior: Behavior normal.     Comments: stressed           Takayla Baillie M Scarlettrose Costilow, PA-C

## 2023-12-29 ENCOUNTER — Other Ambulatory Visit: Payer: Self-pay

## 2023-12-29 ENCOUNTER — Other Ambulatory Visit: Payer: Self-pay | Admitting: Physician Assistant

## 2023-12-29 ENCOUNTER — Encounter: Payer: Self-pay | Admitting: Physician Assistant

## 2023-12-29 ENCOUNTER — Other Ambulatory Visit (HOSPITAL_BASED_OUTPATIENT_CLINIC_OR_DEPARTMENT_OTHER): Payer: Self-pay

## 2023-12-29 DIAGNOSIS — E1169 Type 2 diabetes mellitus with other specified complication: Secondary | ICD-10-CM

## 2023-12-29 MED ORDER — TIRZEPATIDE 7.5 MG/0.5ML ~~LOC~~ SOAJ
7.5000 mg | SUBCUTANEOUS | 1 refills | Status: DC
Start: 2023-12-29 — End: 2024-02-24
  Filled 2023-12-29: qty 2, 28d supply, fill #0
  Filled 2024-01-30: qty 2, 28d supply, fill #1

## 2023-12-29 MED ORDER — TIRZEPATIDE 7.5 MG/0.5ML ~~LOC~~ SOAJ
7.5000 mg | SUBCUTANEOUS | 1 refills | Status: DC
Start: 2023-12-29 — End: 2023-12-29

## 2024-01-30 ENCOUNTER — Other Ambulatory Visit: Payer: Self-pay | Admitting: Physician Assistant

## 2024-01-30 ENCOUNTER — Other Ambulatory Visit (HOSPITAL_BASED_OUTPATIENT_CLINIC_OR_DEPARTMENT_OTHER): Payer: Self-pay

## 2024-01-30 ENCOUNTER — Other Ambulatory Visit: Payer: Self-pay

## 2024-01-30 MED ORDER — CLOBETASOL PROPIONATE 0.05 % EX SOLN
1.0000 | Freq: Two times a day (BID) | CUTANEOUS | 5 refills | Status: DC
  Filled 2024-01-30: qty 50, 25d supply, fill #0
  Filled 2024-04-24: qty 50, 25d supply, fill #1
  Filled 2024-05-17: qty 50, 25d supply, fill #2

## 2024-01-30 MED ORDER — CICLOPIROX 1 % EX SHAM
1.0000 | MEDICATED_SHAMPOO | Freq: Every day | CUTANEOUS | 3 refills | Status: DC
  Filled 2024-01-30: qty 120, 30d supply, fill #0
  Filled 2024-04-24: qty 120, 30d supply, fill #1

## 2024-02-07 ENCOUNTER — Ambulatory Visit: Payer: Commercial Managed Care - PPO | Admitting: Physician Assistant

## 2024-02-24 ENCOUNTER — Ambulatory Visit: Payer: Commercial Managed Care - PPO | Admitting: Physician Assistant

## 2024-02-24 ENCOUNTER — Encounter: Payer: Self-pay | Admitting: Physician Assistant

## 2024-02-24 ENCOUNTER — Other Ambulatory Visit (HOSPITAL_BASED_OUTPATIENT_CLINIC_OR_DEPARTMENT_OTHER): Payer: Self-pay

## 2024-02-24 VITALS — BP 124/78 | HR 87 | Temp 97.3°F | Ht 69.5 in | Wt 263.8 lb

## 2024-02-24 DIAGNOSIS — Z7985 Long-term (current) use of injectable non-insulin antidiabetic drugs: Secondary | ICD-10-CM

## 2024-02-24 DIAGNOSIS — F439 Reaction to severe stress, unspecified: Secondary | ICD-10-CM

## 2024-02-24 DIAGNOSIS — R110 Nausea: Secondary | ICD-10-CM

## 2024-02-24 DIAGNOSIS — K219 Gastro-esophageal reflux disease without esophagitis: Secondary | ICD-10-CM

## 2024-02-24 DIAGNOSIS — F419 Anxiety disorder, unspecified: Secondary | ICD-10-CM | POA: Diagnosis not present

## 2024-02-24 DIAGNOSIS — E669 Obesity, unspecified: Secondary | ICD-10-CM

## 2024-02-24 DIAGNOSIS — E1169 Type 2 diabetes mellitus with other specified complication: Secondary | ICD-10-CM

## 2024-02-24 LAB — POCT GLYCOSYLATED HEMOGLOBIN (HGB A1C): Hemoglobin A1C: 5.3 % (ref 4.0–5.6)

## 2024-02-24 MED ORDER — PANTOPRAZOLE SODIUM 20 MG PO TBEC
20.0000 mg | DELAYED_RELEASE_TABLET | Freq: Two times a day (BID) | ORAL | 1 refills | Status: DC
  Filled 2024-02-24: qty 60, 30d supply, fill #0
  Filled 2024-05-17: qty 60, 30d supply, fill #1

## 2024-02-24 MED ORDER — TIRZEPATIDE 10 MG/0.5ML ~~LOC~~ SOAJ
10.0000 mg | SUBCUTANEOUS | 2 refills | Status: DC
  Filled 2024-02-24: qty 2, 28d supply, fill #0

## 2024-02-24 NOTE — Progress Notes (Signed)
 Patient ID: Rachel Curtis, female    DOB: 08/18/1976, 48 y.o.   MRN: [id number]   Assessment & Plan:  Type 2 diabetes mellitus with obesity (HCC) -     POCT glycosylated hemoglobin (Hb A1C) -     Tirzepatide; Inject 10 mg into the skin once a week.  Dispense: 2 mL; Refill: 2  Nausea -     Pantoprazole Sodium; Take 1 tablet (20 mg total) by mouth 2 (two) times daily before a meal.  Dispense: 60 tablet; Refill: 1 -     Tirzepatide; Inject 10 mg into the skin once a week.  Dispense: 2 mL; Refill: 2  Gastroesophageal reflux disease without esophagitis -     Pantoprazole Sodium; Take 1 tablet (20 mg total) by mouth 2 (two) times daily before a meal.  Dispense: 60 tablet; Refill: 1  Anxiety  Stress     Assessment and Plan    Mother's Lung Cancer Newly diagnosed stage 1A lung cancer in patient's mother. Discussed the treatment plan and the need for her mother to quit smoking. -No direct plan for the patient as this is not her medical issue.  Gastroesophageal Reflux Disease (GERD) Patient reports sour burps and indigestion, not fully controlled with current medication. -Start Protonix 20mg  once daily, can increase to twice daily if needed.  Weight Management Patient has started a new exercise regimen and is seeing some improvement in bloating and tightness.  -Increase Mounjaro to 10 mg weekly -Encourage continuation of exercise regimen.   T2DM - Very good control. Cont Mounjaro 10 mg qw.  Lab Results  Component Value Date   HGBA1C 5.3 02/24/2024   HGBA1C 5.7 (A) 12/06/2023   HGBA1C 5.1 03/10/2023      Return in about 3 months (around 05/26/2024) for recheck/follow-up.    Subjective:    Chief Complaint  Patient presents with   Medical Management of Chronic Issues    Pt in office to f/u for anxiety; pt wanting to discuss dose increase on Mounjaro; and also discuss a prescription for Protonix;     HPI Discussed the use of AI scribe software for clinical note  transcription with the patient, who gave verbal consent to proceed.  History of Present Illness   The patient presents with concerns about her mother's recent lung cancer diagnosis and personal health updates.  Her mother has been diagnosed with stage 1A lung cancer following a low-dose CT scan. Despite the diagnosis, her mother continues to vape, which is a concern for her. Her mother is scheduled to undergo five radiation treatments starting in two weeks.  She experiences 'sour burps and indigestion', particularly at night. She is currently taking Mounjaro 7.5 mg and is considering an increase in dosage due to persistent symptoms. She uses a wedge pillow to alleviate discomfort.  Her A1c level is 5.3, and she is pleased with this result. She has made dietary changes, noting a decreased craving for sweets, although she still enjoys bread and butter.  She has started a new exercise routine using a virtual reality headset, which she finds enjoyable and effective. She exercises five days a week for an hour each session, which has helped her feel less bloated and tight.  She is an only child and is responsible for her mother's care. She has children, including twins and an eight-year-old daughter.       Past Medical History:  Diagnosis Date   Allergy    Anxiety    History of chickenpox  Past Surgical History:  Procedure Laterality Date   CHOLECYSTECTOMY  2001   INTRAUTERINE DEVICE (IUD) INSERTION     mirena inserted 10-15-21   WISDOM TOOTH EXTRACTION  1999    Family History  Problem Relation Age of Onset   Hyperlipidemia Mother    Hypertension Mother    Lung cancer Mother        hx smoking   Diabetes Father    Heart disease Father    Arthritis Father    Diabetes Maternal Grandmother    Dementia Paternal Grandmother    Alcohol abuse Paternal Grandfather    Breast cancer Maternal Aunt    Cancer Maternal Uncle        colon ca    Social History   Tobacco Use   Smoking  status: Never   Smokeless tobacco: Never  Vaping Use   Vaping status: Never Used  Substance Use Topics   Alcohol use: Yes    Comment: 1 drink/month   Drug use: No     No Known Allergies  Review of Systems NEGATIVE UNLESS OTHERWISE INDICATED IN HPI      Objective:     BP 124/78 (BP Location: Left Arm, Patient Position: Sitting, Cuff Size: Large)   Pulse 87   Temp (!) 97.3 F (36.3 C) (Temporal)   Ht 5' 9.5" (1.765 m)   Wt 263 lb 12.8 oz (119.7 kg)   SpO2 99%   BMI 38.40 kg/m   Wt Readings from Last 3 Encounters:  02/24/24 263 lb 12.8 oz (119.7 kg)  12/06/23 264 lb (119.7 kg)  08/10/23 246 lb (111.6 kg)    BP Readings from Last 3 Encounters:  02/24/24 124/78  12/06/23 130/82  08/10/23 124/86     Physical Exam Vitals and nursing note reviewed.  Constitutional:      Appearance: Normal appearance. She is obese.  Eyes:     Extraocular Movements: Extraocular movements intact.     Conjunctiva/sclera: Conjunctivae normal.     Pupils: Pupils are equal, round, and reactive to light.  Cardiovascular:     Rate and Rhythm: Normal rate and regular rhythm.     Pulses: Normal pulses.     Heart sounds: Normal heart sounds. No murmur heard. Pulmonary:     Effort: Pulmonary effort is normal.     Breath sounds: Normal breath sounds.  Skin:    Findings: No rash.  Neurological:     Mental Status: She is alert.  Psychiatric:        Mood and Affect: Mood normal.        Behavior: Behavior normal.       Rachel Curtis M Rachel Lahmann, PA-C

## 2024-05-24 ENCOUNTER — Other Ambulatory Visit (HOSPITAL_BASED_OUTPATIENT_CLINIC_OR_DEPARTMENT_OTHER): Payer: Self-pay

## 2024-05-24 ENCOUNTER — Ambulatory Visit: Admitting: Physician Assistant

## 2024-05-24 ENCOUNTER — Other Ambulatory Visit: Payer: Self-pay

## 2024-05-24 VITALS — BP 126/84 | HR 82 | Temp 97.5°F | Ht 69.5 in | Wt 268.0 lb

## 2024-05-24 DIAGNOSIS — Z7985 Long-term (current) use of injectable non-insulin antidiabetic drugs: Secondary | ICD-10-CM

## 2024-05-24 DIAGNOSIS — Z6830 Body mass index (BMI) 30.0-30.9, adult: Secondary | ICD-10-CM

## 2024-05-24 DIAGNOSIS — E669 Obesity, unspecified: Secondary | ICD-10-CM

## 2024-05-24 DIAGNOSIS — E1169 Type 2 diabetes mellitus with other specified complication: Secondary | ICD-10-CM | POA: Diagnosis not present

## 2024-05-24 DIAGNOSIS — E66812 Obesity, class 2: Secondary | ICD-10-CM

## 2024-05-24 DIAGNOSIS — Z5181 Encounter for therapeutic drug level monitoring: Secondary | ICD-10-CM | POA: Diagnosis not present

## 2024-05-24 DIAGNOSIS — R632 Polyphagia: Secondary | ICD-10-CM | POA: Diagnosis not present

## 2024-05-24 DIAGNOSIS — F909 Attention-deficit hyperactivity disorder, unspecified type: Secondary | ICD-10-CM

## 2024-05-24 DIAGNOSIS — R825 Elevated urine levels of drugs, medicaments and biological substances: Secondary | ICD-10-CM | POA: Diagnosis not present

## 2024-05-24 LAB — MICROALBUMIN / CREATININE URINE RATIO
Creatinine,U: 78.8 mg/dL
Microalb Creat Ratio: 41.8 mg/g — ABNORMAL HIGH (ref 0.0–30.0)
Microalb, Ur: 3.3 mg/dL — ABNORMAL HIGH (ref 0.0–1.9)

## 2024-05-24 MED ORDER — CICLOPIROX 1 % EX SHAM
1.0000 | MEDICATED_SHAMPOO | Freq: Every day | CUTANEOUS | 3 refills | Status: AC
  Filled 2024-05-24: qty 120, 30d supply, fill #0
  Filled 2024-11-30: qty 120, 30d supply, fill #1

## 2024-05-24 MED ORDER — LISDEXAMFETAMINE DIMESYLATE 30 MG PO CAPS
30.0000 mg | ORAL_CAPSULE | Freq: Every day | ORAL | 0 refills | Status: DC
  Filled 2024-05-24: qty 30, 30d supply, fill #0

## 2024-05-24 MED ORDER — CLOBETASOL PROPIONATE 0.05 % EX SOLN
1.0000 | Freq: Two times a day (BID) | CUTANEOUS | 5 refills | Status: AC
  Filled 2024-05-24 – 2024-08-24 (×2): qty 50, 25d supply, fill #0
  Filled 2024-10-01: qty 50, 25d supply, fill #1
  Filled 2024-11-30: qty 50, 25d supply, fill #2

## 2024-05-24 NOTE — Progress Notes (Signed)
 Patient ID: Rachel Curtis, female    DOB: 29-Nov-1976, 48 y.o.   MRN: [id number]   Assessment & Plan:   Binge eating -     Drug Monitoring Panel [phone] , Urine  Type 2 diabetes mellitus with obesity (HCC) -     Microalbumin / creatinine urine ratio  Adult ADHD -     Drug Monitoring Panel [phone] , Urine  Other orders -     Ciclopirox ; Apply 1 application topically at bedtime.  Dispense: 120 mL; Refill: 3 -     Clobetasol  Propionate; Apply 1 application topically 2 (two) times daily.  Dispense: 50 mL; Refill: 5 -     Lisdexamfetamine Dimesylate; Take 1 capsule (30 mg total) by mouth daily.  Dispense: 30 capsule; Refill: 0    Binge Eating Disorder with Suspected Adult ADHD She exhibits symptoms consistent with binge eating disorder and suspected adult ADHD, similar to her daughter's diagnosis. Symptoms include brain fog, forgetfulness, and difficulty focusing. Vyvanse, effective for both ADHD and binge eating disorder, was discussed as a treatment option. She expressed interest in trying Vyvanse, understanding the potential side effects such as tachycardia and palpitations. Regular follow-up is required due to Vyvanse being a controlled substance. - Start Vyvanse 30 mg once daily in the morning. - Perform urine drug screen. - Schedule follow-up appointment in 3 months to assess response to Vyvanse and discuss nutrition and weight loss plan.  Obesity She struggles with weight management and has previously used Ozempic  and Mounjaro . She experienced a plateau with Ozempic  and intolerable side effects with Mounjaro . Vyvanse may aid in weight management by curbing appetite and increasing focus, potentially benefiting meal planning and adherence to a weight loss regimen. - Initiate Vyvanse 30 mg daily to assist with weight management. - Discuss nutrition and weight loss plan at follow-up in 3 months.  Type 2 Diabetes Mellitus She manages type 2 diabetes with previous use of Ozempic  and  Mounjaro , discontinued due to side effects. Her A1c was 5.3, indicating good glycemic control. Diabetes management will be monitored, especially with Vyvanse introduction, which may affect appetite and weight. - Perform urine microalbumin test for diabetes monitoring. - Monitor blood sugar levels and adjust treatment as needed. Lab Results  Component Value Date   HGBA1C 5.3 02/24/2024   HGBA1C 5.7 (A) 12/06/2023   HGBA1C 5.1 03/10/2023     Gastroesophageal Reflux Disease (GERD) She manages GERD with Protonix  twice daily. Gastrointestinal symptoms exacerbated by Mounjaro  resolved after discontinuation. Protonix  continues to manage symptoms and prevent irritation from foods like tomato sauce. - Continue Protonix  twice daily.         Return in about 3 months (around 08/24/2024) for recheck/follow-up.    Subjective:    Chief Complaint  Patient presents with   Diabetes    Pt in today for 3 month follow up; pt unable to take GLP's without getting sick; pt admits added stress and feeling of brain fog lately wondering if menopausal also fluid rentention in ankle and feet.     Diabetes   Discussed the use of AI scribe software for clinical note transcription with the patient, who gave verbal consent to proceed.  History of Present Illness Rachel Curtis is a 48 year old female with type 2 diabetes and obesity who presents with gastrointestinal issues and weight management concerns.  She has been experiencing ongoing gastrointestinal issues exacerbated by medications taken for weight management and type 2 diabetes. She experienced severe gastrointestinal side effects, including 'sour  burps' and frequent bathroom visits, while on Mounjaro , which she found intolerable. Mounjaro  did not aid in weight loss as expected, unlike Ozempic , which initially helped her lose 30 pounds before reaching a plateau.  She discontinued Mounjaro  due to the severity of the side effects and is currently  not on any medication for her diabetes or weight management. She continues to take Protonix  twice daily, in the morning and afternoon, to manage her gastrointestinal symptoms, which include pain and discomfort, especially after consuming certain foods like tomato sauce. She feels better since stopping Mounjaro  but remains cautious about her gastrointestinal health.  She has a history of type 2 diabetes, with her last A1c recorded at 5.3. She has been off diabetes medication for about three weeks. She also reports swelling and 'weirdness' in her feet, although her blood pressure is reportedly good. She is concerned about her weight, aiming to reduce it further to alleviate joint pain and other health issues.  She describes symptoms of forgetfulness, difficulty focusing, and feeling overwhelmed, noting similarities with her daughter's diagnosed ADHD. She experiences significant brain fog, forgetfulness, and difficulty focusing, which she attributes to menopause and her busy lifestyle. She finds herself irritable and overwhelmed by daily tasks and responsibilities, impacting her ability to manage her household and work efficiently.     Past Medical History:  Diagnosis Date   Allergy    Anxiety    History of chickenpox     Past Surgical History:  Procedure Laterality Date   CHOLECYSTECTOMY  2001   INTRAUTERINE DEVICE (IUD) INSERTION     mirena  inserted 10-15-21   WISDOM TOOTH EXTRACTION  1999    Family History  Problem Relation Age of Onset   Hyperlipidemia Mother    Hypertension Mother    Lung cancer Mother        hx smoking   Diabetes Father    Heart disease Father    Arthritis Father    Diabetes Maternal Grandmother    Dementia Paternal Grandmother    Alcohol abuse Paternal Grandfather    Breast cancer Maternal Aunt    Cancer Maternal Uncle        colon ca    Social History   Tobacco Use   Smoking status: Never   Smokeless tobacco: Never  Vaping Use   Vaping status:  Never Used  Substance Use Topics   Alcohol use: Yes    Comment: 1 drink/month   Drug use: No     No Known Allergies  Review of Systems NEGATIVE UNLESS OTHERWISE INDICATED IN HPI      Objective:     BP 126/84 (BP Location: Left Arm, Patient Position: Sitting, Cuff Size: Large)   Pulse 82   Temp (!) 97.5 F (36.4 C) (Temporal)   Ht 5' 9.5" (1.765 m)   Wt 268 lb (121.6 kg)   SpO2 99%   BMI 39.01 kg/m   Wt Readings from Last 3 Encounters:  05/24/24 268 lb (121.6 kg)  02/24/24 263 lb 12.8 oz (119.7 kg)  12/06/23 264 lb (119.7 kg)    BP Readings from Last 3 Encounters:  05/24/24 126/84  02/24/24 124/78  12/06/23 130/82     Physical Exam Vitals and nursing note reviewed.  Constitutional:      Appearance: Normal appearance. She is obese.  Eyes:     Extraocular Movements: Extraocular movements intact.     Conjunctiva/sclera: Conjunctivae normal.     Pupils: Pupils are equal, round, and reactive to light.  Cardiovascular:  Rate and Rhythm: Normal rate and regular rhythm.     Pulses: Normal pulses.     Heart sounds: Normal heart sounds. No murmur heard. Pulmonary:     Effort: Pulmonary effort is normal.     Breath sounds: Normal breath sounds.  Skin:    Findings: No rash.  Neurological:     Mental Status: She is alert.  Psychiatric:        Mood and Affect: Mood normal.        Behavior: Behavior normal.             Demia Viera M Duell Holdren, PA-C

## 2024-05-27 LAB — PRESCRIBED DRUGS,MEDMATCH(R)

## 2024-05-27 LAB — DRUG MONITOR, TRAMADOL,QN, URINE
Desmethyltramadol: NEGATIVE ng/mL (ref <100)
Tramadol: NEGATIVE ng/mL (ref <100)

## 2024-05-27 LAB — DRUG MONITOR, OXYCODONE,W/CONF, URINE: Oxycodone: NEGATIVE ng/mL (ref <100)

## 2024-05-27 LAB — DRUG MONITOR,BARBITURATE,W/CONF, URINE: Barbiturates: NEGATIVE ng/mL (ref <300)

## 2024-05-27 LAB — DRUG MONITOR, OPIATES,W/CONF, URINE: Opiates: NEGATIVE ng/mL (ref <100)

## 2024-05-27 LAB — DRUG MONITOR, BENZO,W/CONF, URINE: Benzodiazepines: NEGATIVE ng/mL (ref <100)

## 2024-05-27 LAB — DRUG MONITOR, COCAINEMETAB, W/CONF, URINE: Cocaine Metabolite: NEGATIVE ng/mL (ref <150)

## 2024-05-27 LAB — DRUG MONITOR,AMPHETAMINE,W/CONF, URINE: Amphetamines: NEGATIVE ng/mL (ref <500)

## 2024-05-27 LAB — DM TEMPLATE

## 2024-05-28 ENCOUNTER — Other Ambulatory Visit: Payer: Self-pay | Admitting: Physician Assistant

## 2024-05-28 ENCOUNTER — Ambulatory Visit: Payer: Self-pay | Admitting: Physician Assistant

## 2024-05-28 DIAGNOSIS — E1169 Type 2 diabetes mellitus with other specified complication: Secondary | ICD-10-CM

## 2024-05-28 DIAGNOSIS — R809 Proteinuria, unspecified: Secondary | ICD-10-CM

## 2024-05-28 MED ORDER — DAPAGLIFLOZIN PROPANEDIOL 5 MG PO TABS
5.0000 mg | ORAL_TABLET | Freq: Every day | ORAL | 2 refills | Status: DC
  Filled 2024-05-28: qty 30, 30d supply, fill #0
  Filled 2024-06-26: qty 30, 30d supply, fill #1
  Filled 2024-07-23: qty 30, 30d supply, fill #2

## 2024-05-29 ENCOUNTER — Other Ambulatory Visit (HOSPITAL_BASED_OUTPATIENT_CLINIC_OR_DEPARTMENT_OTHER): Payer: Self-pay

## 2024-06-19 ENCOUNTER — Other Ambulatory Visit: Payer: Self-pay | Admitting: Physician Assistant

## 2024-06-19 ENCOUNTER — Other Ambulatory Visit: Payer: Self-pay

## 2024-06-19 ENCOUNTER — Encounter: Payer: Self-pay | Admitting: Physician Assistant

## 2024-06-19 ENCOUNTER — Other Ambulatory Visit (HOSPITAL_BASED_OUTPATIENT_CLINIC_OR_DEPARTMENT_OTHER): Payer: Self-pay

## 2024-06-19 ENCOUNTER — Encounter (HOSPITAL_BASED_OUTPATIENT_CLINIC_OR_DEPARTMENT_OTHER): Payer: Self-pay

## 2024-06-19 MED ORDER — LISDEXAMFETAMINE DIMESYLATE 30 MG PO CAPS
30.0000 mg | ORAL_CAPSULE | Freq: Every day | ORAL | 0 refills | Status: DC
  Filled 2024-06-19 – 2024-06-21 (×2): qty 30, 30d supply, fill #0

## 2024-06-19 NOTE — Telephone Encounter (Signed)
 Refill requested for Vyvanse . Pts next office visit is 08/28/24

## 2024-06-20 NOTE — Telephone Encounter (Signed)
 Please see pt msg and advise on refill

## 2024-06-21 ENCOUNTER — Other Ambulatory Visit (HOSPITAL_BASED_OUTPATIENT_CLINIC_OR_DEPARTMENT_OTHER): Payer: Self-pay

## 2024-07-23 ENCOUNTER — Other Ambulatory Visit: Payer: Self-pay

## 2024-07-23 ENCOUNTER — Other Ambulatory Visit: Payer: Self-pay | Admitting: Physician Assistant

## 2024-07-23 ENCOUNTER — Other Ambulatory Visit (HOSPITAL_BASED_OUTPATIENT_CLINIC_OR_DEPARTMENT_OTHER): Payer: Self-pay

## 2024-07-23 DIAGNOSIS — R110 Nausea: Secondary | ICD-10-CM

## 2024-07-23 DIAGNOSIS — K219 Gastro-esophageal reflux disease without esophagitis: Secondary | ICD-10-CM

## 2024-07-23 MED ORDER — PANTOPRAZOLE SODIUM 20 MG PO TBEC
20.0000 mg | DELAYED_RELEASE_TABLET | Freq: Two times a day (BID) | ORAL | 2 refills | Status: DC
  Filled 2024-07-23 (×2): qty 60, 30d supply, fill #0

## 2024-07-23 NOTE — Telephone Encounter (Signed)
 Last OV: 05/24/24  Next OV: 08/28/24  Last Filled: 06/19/24  Quantity: 30

## 2024-07-24 MED ORDER — LISDEXAMFETAMINE DIMESYLATE 30 MG PO CAPS
30.0000 mg | ORAL_CAPSULE | Freq: Every day | ORAL | 0 refills | Status: DC
  Filled 2024-07-24: qty 30, 30d supply, fill #0

## 2024-07-25 ENCOUNTER — Other Ambulatory Visit (HOSPITAL_BASED_OUTPATIENT_CLINIC_OR_DEPARTMENT_OTHER): Payer: Self-pay

## 2024-08-24 ENCOUNTER — Other Ambulatory Visit: Payer: Self-pay | Admitting: Physician Assistant

## 2024-08-24 ENCOUNTER — Other Ambulatory Visit: Payer: Self-pay

## 2024-08-24 ENCOUNTER — Other Ambulatory Visit (HOSPITAL_BASED_OUTPATIENT_CLINIC_OR_DEPARTMENT_OTHER): Payer: Self-pay

## 2024-08-24 DIAGNOSIS — E1169 Type 2 diabetes mellitus with other specified complication: Secondary | ICD-10-CM

## 2024-08-24 DIAGNOSIS — R809 Proteinuria, unspecified: Secondary | ICD-10-CM

## 2024-08-24 MED ORDER — DAPAGLIFLOZIN PROPANEDIOL 5 MG PO TABS
5.0000 mg | ORAL_TABLET | Freq: Every day | ORAL | 2 refills | Status: DC
  Filled 2024-08-24: qty 30, 30d supply, fill #0
  Filled 2024-09-28: qty 30, 30d supply, fill #1
  Filled 2024-11-04: qty 30, 30d supply, fill #2

## 2024-08-24 NOTE — Telephone Encounter (Signed)
 Refill request for Vyvanse  30mg  30cap last fill 07/24/24. Last OV 05/24/24.

## 2024-08-25 MED ORDER — LISDEXAMFETAMINE DIMESYLATE 30 MG PO CAPS
30.0000 mg | ORAL_CAPSULE | Freq: Every day | ORAL | 0 refills | Status: DC
  Filled 2024-08-25: qty 30, 30d supply, fill #0

## 2024-08-26 ENCOUNTER — Other Ambulatory Visit (HOSPITAL_BASED_OUTPATIENT_CLINIC_OR_DEPARTMENT_OTHER): Payer: Self-pay

## 2024-08-27 ENCOUNTER — Other Ambulatory Visit (HOSPITAL_BASED_OUTPATIENT_CLINIC_OR_DEPARTMENT_OTHER): Payer: Self-pay

## 2024-08-28 ENCOUNTER — Ambulatory Visit: Admitting: Physician Assistant

## 2024-08-29 ENCOUNTER — Ambulatory Visit: Admitting: Obstetrics and Gynecology

## 2024-09-04 ENCOUNTER — Other Ambulatory Visit (HOSPITAL_BASED_OUTPATIENT_CLINIC_OR_DEPARTMENT_OTHER): Payer: Self-pay

## 2024-09-04 ENCOUNTER — Ambulatory Visit: Admitting: Physician Assistant

## 2024-09-04 ENCOUNTER — Encounter: Payer: Self-pay | Admitting: Physician Assistant

## 2024-09-04 VITALS — BP 142/88 | HR 89 | Temp 97.9°F | Ht 69.5 in | Wt 262.0 lb

## 2024-09-04 DIAGNOSIS — R632 Polyphagia: Secondary | ICD-10-CM | POA: Diagnosis not present

## 2024-09-04 DIAGNOSIS — E7800 Pure hypercholesterolemia, unspecified: Secondary | ICD-10-CM

## 2024-09-04 DIAGNOSIS — E1169 Type 2 diabetes mellitus with other specified complication: Secondary | ICD-10-CM | POA: Diagnosis not present

## 2024-09-04 DIAGNOSIS — R030 Elevated blood-pressure reading, without diagnosis of hypertension: Secondary | ICD-10-CM

## 2024-09-04 DIAGNOSIS — R0981 Nasal congestion: Secondary | ICD-10-CM

## 2024-09-04 DIAGNOSIS — F909 Attention-deficit hyperactivity disorder, unspecified type: Secondary | ICD-10-CM

## 2024-09-04 DIAGNOSIS — Z7984 Long term (current) use of oral hypoglycemic drugs: Secondary | ICD-10-CM

## 2024-09-04 MED ORDER — LISDEXAMFETAMINE DIMESYLATE 40 MG PO CAPS
40.0000 mg | ORAL_CAPSULE | ORAL | 0 refills | Status: DC
  Filled 2024-09-04: qty 30, 30d supply, fill #0

## 2024-09-04 NOTE — Progress Notes (Signed)
 Patient ID: Rachel Curtis, female    DOB: [birth year], 48 y.o.   MRN: [id number]   Assessment & Plan:  Type 2 diabetes mellitus with obesity (HCC) -     Comprehensive metabolic panel with GFR; Future -     Hemoglobin A1c; Future -     Lipid panel; Future  Elevated LDL cholesterol level -     Lipid panel; Future  Adult ADHD -     Lisdexamfetamine Dimesylate ; Take 1 capsule (40 mg total) by mouth every morning.  Dispense: 30 capsule; Refill: 0  Binge eating -     Lisdexamfetamine Dimesylate ; Take 1 capsule (40 mg total) by mouth every morning.  Dispense: 30 capsule; Refill: 0  Sinus congestion  Elevated blood pressure reading    Assessment & Plan Adult attention-deficit hyperactivity disorder (ADHD) and binge eating disorder Adult ADHD and binge eating disorder managed with Vyvanse  30 mg, effective but wears off by mid-afternoon. - Increase Vyvanse  to 40 mg daily. - Monitor for changes in sleep patterns or increased side effects. - Reassess in three months for medication check.  Type 2 diabetes mellitus Type 2 diabetes managed with Farxiga  5 mg, maintaining good glycemic control. No current statin use, typically recommended for diabetes management. - Order A1c, CMP, and lipid panel to assess diabetes control and cholesterol levels. - Discuss potential initiation of statin therapy based on lipid panel results. - Continue lifestyle modifications to support diabetes management. Lab Results  Component Value Date   HGBA1C 5.3 02/24/2024   HGBA1C 5.7 (A) 12/06/2023   HGBA1C 5.1 03/10/2023     Acute sinus congestion Acute sinus congestion likely contributing to elevated blood pressure. Currently using Sudafed and Flonase  for symptom relief. - Continue using Flonase  for nasal congestion. - Monitor blood pressure to assess if elevation persists after discontinuing Sudafed.  Elevated blood pressure, likely medication-induced Elevated blood pressure likely secondary to  Sudafed use. No hypertension history. - Recheck blood pressure before leaving the clinic. - Monitor blood pressure readings at home and report if elevation persists after stopping Sudafed.  General Health Maintenance Overdue physical examination and lab work. Last lab work was two years ago. Needs flu shot and physical examination. - Schedule physical examination. - Order flu shot. - Order lab work including A1c, CMP, and lipid panel.      Return in about 3 months (around 12/04/2024) for recheck/follow-up.    Subjective:    Chief Complaint  Patient presents with   Diabetes   Follow-up    38mo; nothing new, no concerns    HPI Discussed the use of AI scribe software for clinical note transcription with the patient, who gave verbal consent to proceed.  History of Present Illness Rachel Curtis is a 48 year old female who presents for medication management and follow-up.  She is experiencing sinus congestion, describing her symptoms as 'so snotty'. She has been using Sudafed, which she suspects is causing elevated blood pressure. She uses Flonase  nasal spray for relief and is monitoring her blood pressure to see if it normalizes after stopping Sudafed.  She is currently taking Vyvanse  for adult ADHD, which she finds effective, although its effects diminish by mid-afternoon. She takes it at 7:00 AM and notices a decrease in efficacy around 2:00 PM.  She has type 2 diabetes managed with Farxiga  5 mg. She previously used GLP medications but now only takes Farxiga , which maintains her A1c well controlled. She is not on any statin medication.  She is  experiencing stress related to work-life balance, particularly due to her job's demands affecting her ability to manage home responsibilities, including caring for her daughter, Schuyler, who is in fourth grade and has an IEP.     Past Medical History:  Diagnosis Date   Allergy    Anxiety    History of chickenpox     Past  Surgical History:  Procedure Laterality Date   CHOLECYSTECTOMY  2001   INTRAUTERINE DEVICE (IUD) INSERTION     mirena  inserted 10-15-21   WISDOM TOOTH EXTRACTION  1999    Family History  Problem Relation Age of Onset   Hyperlipidemia Mother    Hypertension Mother    Lung cancer Mother        hx smoking   Diabetes Father    Heart disease Father    Arthritis Father    Diabetes Maternal Grandmother    Dementia Paternal Grandmother    Alcohol abuse Paternal Grandfather    Breast cancer Maternal Aunt    Cancer Maternal Uncle        colon ca    Social History   Tobacco Use   Smoking status: Never   Smokeless tobacco: Never  Vaping Use   Vaping status: Never Used  Substance Use Topics   Alcohol use: Yes    Comment: 1 drink/month   Drug use: No     No Known Allergies  Review of Systems NEGATIVE UNLESS OTHERWISE INDICATED IN HPI      Objective:     BP (!) 142/88 (BP Location: Left Arm, Patient Position: Sitting)   Pulse 89   Temp 97.9 F (36.6 C)   Ht 5' 9.5 (1.765 m)   Wt 262 lb (118.8 kg)   SpO2 98%   BMI 38.14 kg/m   Wt Readings from Last 3 Encounters:  09/04/24 262 lb (118.8 kg)  05/24/24 268 lb (121.6 kg)  02/24/24 263 lb 12.8 oz (119.7 kg)    BP Readings from Last 3 Encounters:  09/04/24 (!) 142/88  05/24/24 126/84  02/24/24 124/78     Physical Exam Vitals and nursing note reviewed.  Constitutional:      General: She is not in acute distress.    Appearance: Normal appearance. She is obese. She is not ill-appearing.  HENT:     Head: Normocephalic.     Right Ear: External ear normal.     Left Ear: External ear normal.     Nose: Congestion present.  Eyes:     Extraocular Movements: Extraocular movements intact.     Conjunctiva/sclera: Conjunctivae normal.     Pupils: Pupils are equal, round, and reactive to light.  Cardiovascular:     Rate and Rhythm: Normal rate and regular rhythm.     Pulses: Normal pulses.     Heart sounds: Normal  heart sounds. No murmur heard. Pulmonary:     Effort: Pulmonary effort is normal. No respiratory distress.     Breath sounds: Normal breath sounds. No wheezing.  Musculoskeletal:     Cervical back: Normal range of motion.  Skin:    General: Skin is warm.  Neurological:     Mental Status: She is alert and oriented to person, place, and time.  Psychiatric:        Mood and Affect: Mood normal.        Behavior: Behavior normal.             Woody Kronberg M Rachid Parham, PA-C

## 2024-09-04 NOTE — Patient Instructions (Signed)
  VISIT SUMMARY: Today, we discussed your sinus congestion, ADHD medication, diabetes management, and blood pressure. We also reviewed your general health maintenance needs.  YOUR PLAN: ADULT ADHD AND BINGE EATING DISORDER: Your ADHD and binge eating disorder are currently managed with Vyvanse , but its effects diminish by mid-afternoon. -Increase Vyvanse  to 40 mg daily. -Monitor for changes in sleep patterns or increased side effects. -Reassess in three months for a medication check.  TYPE 2 DIABETES MELLITUS: Your diabetes is well controlled with Farxiga , but we need to check your A1c and cholesterol levels. -Order A1c, CMP, and lipid panel to assess diabetes control and cholesterol levels. -Discuss potential initiation of statin therapy based on lipid panel results. -Continue lifestyle modifications to support diabetes management.  ACUTE SINUS CONGESTION: You are experiencing sinus congestion, which may be contributing to elevated blood pressure. -Continue using Flonase  for nasal congestion. -Monitor blood pressure to assess if elevation persists after discontinuing Sudafed.  ELEVATED BLOOD PRESSURE, LIKELY MEDICATION-INDUCED: Your elevated blood pressure is likely due to Sudafed use. -Recheck blood pressure before leaving the clinic. -Monitor blood pressure readings at home and report if elevation persists after stopping Sudafed.  GENERAL HEALTH MAINTENANCE: You are overdue for a physical examination and lab work. -Schedule physical examination. -Order flu shot. -Order lab work including A1c, CMP, and lipid panel.                      Contains text generated by [employer].                                 Contains text generated by [employer].

## 2024-09-19 ENCOUNTER — Ambulatory Visit: Admitting: Obstetrics and Gynecology

## 2024-09-28 ENCOUNTER — Other Ambulatory Visit: Payer: Self-pay

## 2024-09-28 ENCOUNTER — Other Ambulatory Visit: Payer: Self-pay | Admitting: Physician Assistant

## 2024-09-28 DIAGNOSIS — F909 Attention-deficit hyperactivity disorder, unspecified type: Secondary | ICD-10-CM

## 2024-09-28 DIAGNOSIS — R632 Polyphagia: Secondary | ICD-10-CM

## 2024-09-28 NOTE — Telephone Encounter (Signed)
 Last OV: 09/04/24  Next OV: 11/27/24  Last filled: 09/04/24  Quantity: 30

## 2024-09-30 MED ORDER — LISDEXAMFETAMINE DIMESYLATE 40 MG PO CAPS
40.0000 mg | ORAL_CAPSULE | ORAL | 0 refills | Status: DC
  Filled 2024-09-30 – 2024-10-02 (×2): qty 30, 30d supply, fill #0

## 2024-10-01 ENCOUNTER — Other Ambulatory Visit (HOSPITAL_BASED_OUTPATIENT_CLINIC_OR_DEPARTMENT_OTHER): Payer: Self-pay

## 2024-10-02 ENCOUNTER — Other Ambulatory Visit (HOSPITAL_BASED_OUTPATIENT_CLINIC_OR_DEPARTMENT_OTHER): Payer: Self-pay

## 2024-10-25 ENCOUNTER — Telehealth: Payer: Self-pay | Admitting: Physician Assistant

## 2024-10-25 ENCOUNTER — Other Ambulatory Visit: Payer: Self-pay

## 2024-10-25 DIAGNOSIS — Z0000 Encounter for general adult medical examination without abnormal findings: Secondary | ICD-10-CM

## 2024-10-25 DIAGNOSIS — R030 Elevated blood-pressure reading, without diagnosis of hypertension: Secondary | ICD-10-CM

## 2024-10-25 DIAGNOSIS — E66812 Obesity, class 2: Secondary | ICD-10-CM

## 2024-10-25 NOTE — Telephone Encounter (Addendum)
 Patient requests to be rescheduled for CPE on 12/04/24 at 3pm (Back to back CPE's). Please advise.

## 2024-10-26 NOTE — Telephone Encounter (Signed)
 LVM to schedule fasting lab appt 1 week prior to CPE 12/04/24

## 2024-11-01 ENCOUNTER — Encounter: Payer: Self-pay | Admitting: Family Medicine

## 2024-11-01 ENCOUNTER — Ambulatory Visit: Admitting: Family Medicine

## 2024-11-01 ENCOUNTER — Other Ambulatory Visit (HOSPITAL_BASED_OUTPATIENT_CLINIC_OR_DEPARTMENT_OTHER): Payer: Self-pay

## 2024-11-01 VITALS — BP 138/100 | HR 94 | Resp 18 | Ht 69.5 in | Wt 260.2 lb

## 2024-11-01 DIAGNOSIS — J0140 Acute pansinusitis, unspecified: Secondary | ICD-10-CM | POA: Diagnosis not present

## 2024-11-01 MED ORDER — AMOXICILLIN-POT CLAVULANATE 875-125 MG PO TABS
1.0000 | ORAL_TABLET | Freq: Two times a day (BID) | ORAL | 0 refills | Status: DC
  Filled 2024-11-01: qty 20, 10d supply, fill #0

## 2024-11-01 MED ORDER — FLUCONAZOLE 150 MG PO TABS
150.0000 mg | ORAL_TABLET | Freq: Every day | ORAL | 0 refills | Status: AC
  Filled 2024-11-01: qty 2, 2d supply, fill #0

## 2024-11-01 NOTE — Progress Notes (Signed)
 Established Patient Office Visit  Subjective   Patient ID: Rachel Curtis, female    DOB: 02-02-1976  Age: 48 y.o. 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   Gastroesophageal reflux disease without esophagitis 09/24/2022   Type 2 diabetes mellitus with obesity 08/26/2022   Acute serous otitis media, recurrent, unspecified ear 05/28/2022   Acute vaginitis 03/29/2022   Allergy, unspecified, initial encounter 07/16/2021   Acute suppr otitis media w/o spon rupt ear drum, right ear 07/16/2021   Allergic rhinitis due to pollen 01/12/2018   Eustachian tube dysfunction 01/12/2018   TMJ (dislocation of temporomandibular joint) 01/12/2018   Obesity (BMI 30.0-34.9) 04/28/2017   Past Medical History:  Diagnosis Date   Allergy    Anxiety    History of chickenpox    Past Surgical History:  Procedure Laterality Date   CHOLECYSTECTOMY  2001   INTRAUTERINE DEVICE (IUD) INSERTION     mirena  inserted 10-15-21   WISDOM TOOTH EXTRACTION  1999   Social History   Tobacco Use   Smoking status: Never   Smokeless tobacco: Never  Vaping Use   Vaping status: Never Used  Substance Use Topics   Alcohol use: Yes    Comment: 1 drink/month   Drug use: No   Social History   Socioeconomic History   Marital status: Married    Spouse name: Koren   Number of children: 4   Years of education: Not on file   Highest education level: 12th grade  Occupational History   Occupation: Magazine Features Editor: [employer]  Tobacco Use   Smoking status: Never   Smokeless tobacco: Never  Vaping Use   Vaping status: Never Used  Substance and Sexual Activity   Alcohol use: Yes    Comment: 1 drink/month   Drug use: No   Sexual activity: Yes    Birth control/protection: I.U.D.    Comment: husband vasectomy  Other Topics Concern   Not on file  Social History Narrative   Not  on file   Social Drivers of Health   Financial Resource Strain: Low Risk  (12/06/2023)   Overall Financial Resource Strain (CARDIA)    Difficulty of Paying Living Expenses: Not very hard  Food Insecurity: No Food Insecurity (12/06/2023)   Hunger Vital Sign    Worried About Running Out of Food in the Last Year: Never true    Ran Out of Food in the Last Year: Never true  Transportation Needs: No Transportation Needs (12/06/2023)   PRAPARE - Administrator, Civil Service (Medical): No    Lack of Transportation (Non-Medical): No  Physical Activity: Inactive (12/06/2023)   Exercise Vital Sign    Days of Exercise per Week: 1 day    Minutes of Exercise per Session: 0 min  Stress: Stress Concern Present (12/06/2023)   [employer] of Occupational Health - Occupational Stress Questionnaire    Feeling of Stress : To some extent  Social Connections: Moderately Integrated (12/06/2023)   Social Connection and Isolation Panel    Frequency of Communication with Friends and Family: More than three times a week    Frequency of Social Gatherings with Friends and Family: Once a week    Attends Religious Services: 1 to 4 times per year    Active Member of Clubs or Organizations: No    Attends  Engineer, Structural: Not on file    Marital Status: Married  Catering Manager Violence: Not on file   Family Status  Relation Name Status   Mother  Alive   Father  Deceased at age 73       heart attack   MGM  Deceased   MGF  Deceased   PGM  Deceased   PGF estranged Deceased   Daughter Armed Forces Technical Officer Alive   Daughter emma Alive   Daughter marine scientist Alive   Daughter Casey Alive   Mat Aunt  Alive   Mat Uncle  (Not Specified)  No partnership data on file   Family History  Problem Relation Age of Onset   Hyperlipidemia Mother    Hypertension Mother    Lung cancer Mother        hx smoking   Diabetes Father    Heart disease Father    Arthritis Father    Diabetes Maternal Grandmother     Dementia Paternal Grandmother    Alcohol abuse Paternal Grandfather    Breast cancer Maternal Aunt    Cancer Maternal Uncle        colon ca   No Known Allergies    ROS    Objective:     BP (!) 138/100 (BP Location: Left Arm, Patient Position: Sitting, Cuff Size: Large)   Pulse 94   Resp 18   Ht 5' 9.5 (1.765 m)   Wt 260 lb 3.2 oz (118 kg)   SpO2 99%   BMI 37.87 kg/m  BP Readings from Last 3 Encounters:  11/01/24 (!) 138/100  09/04/24 (!) 142/88  05/24/24 126/84   Wt Readings from Last 3 Encounters:  11/01/24 260 lb 3.2 oz (118 kg)  09/04/24 262 lb (118.8 kg)  05/24/24 268 lb (121.6 kg)    Physical Exam Vitals and nursing note reviewed.  Constitutional:      General: She is not in acute distress.    Appearance: Normal appearance. She is well-developed.  HENT:     Head: Normocephalic and atraumatic.     Nose: Congestion present.     Right Sinus: Maxillary sinus tenderness and frontal sinus tenderness present.     Left Sinus: Maxillary sinus tenderness and frontal sinus tenderness present.  Eyes:     General: No scleral icterus.       Right eye: No discharge.        Left eye: No discharge.  Cardiovascular:     Rate and Rhythm: Normal rate and regular rhythm.     Heart sounds: No murmur heard. Pulmonary:     Effort: Pulmonary effort is normal. No respiratory distress.     Breath sounds: Normal breath sounds.  Musculoskeletal:        General: Normal range of motion.     Cervical back: Normal range of motion and neck supple.     Right lower leg: No edema.     Left lower leg: No edema.  Skin:    General: Skin is warm and dry.  Neurological:     Mental Status: She is alert and oriented to person, place, and time.  Psychiatric:        Mood and Affect: Mood normal.        Behavior: Behavior normal.        Thought Content: Thought content normal.        Judgment: Judgment normal.      No results found for any visits on 11/01/24.     The  10-year  ASCVD risk score (Arnett DK, et al., 2019) is: 5%    Assessment & Plan:   Problem List Items Addressed This Visit   None Visit Diagnoses       Acute non-recurrent pansinusitis    -  Primary   Relevant Medications   amoxicillin -clavulanate (AUGMENTIN ) 875-125 MG tablet   fluconazole  (DIFLUCAN ) 150 MG tablet     Con't flonase  , antihistamine Return to office as needed   No follow-ups on file.    Harmani Neto R Lowne Chase, DO

## 2024-11-12 ENCOUNTER — Other Ambulatory Visit (HOSPITAL_BASED_OUTPATIENT_CLINIC_OR_DEPARTMENT_OTHER): Payer: Self-pay

## 2024-11-12 ENCOUNTER — Other Ambulatory Visit: Payer: Self-pay

## 2024-11-12 ENCOUNTER — Other Ambulatory Visit: Payer: Self-pay | Admitting: Physician Assistant

## 2024-11-12 DIAGNOSIS — R632 Polyphagia: Secondary | ICD-10-CM

## 2024-11-12 DIAGNOSIS — F909 Attention-deficit hyperactivity disorder, unspecified type: Secondary | ICD-10-CM

## 2024-11-12 MED ORDER — LISDEXAMFETAMINE DIMESYLATE 40 MG PO CAPS
40.0000 mg | ORAL_CAPSULE | ORAL | 0 refills | Status: DC
  Filled 2024-11-12: qty 30, 30d supply, fill #0

## 2024-11-12 NOTE — Telephone Encounter (Signed)
 Last OV: 09/04/24  Next OV: 12/04/24  Last filled: 09/30/24  Quantity: 30

## 2024-11-27 ENCOUNTER — Other Ambulatory Visit

## 2024-11-27 ENCOUNTER — Encounter: Admitting: Physician Assistant

## 2024-11-28 ENCOUNTER — Other Ambulatory Visit

## 2024-11-28 DIAGNOSIS — E66812 Obesity, class 2: Secondary | ICD-10-CM | POA: Diagnosis not present

## 2024-11-28 DIAGNOSIS — E669 Obesity, unspecified: Secondary | ICD-10-CM | POA: Diagnosis not present

## 2024-11-28 DIAGNOSIS — Z0000 Encounter for general adult medical examination without abnormal findings: Secondary | ICD-10-CM | POA: Diagnosis not present

## 2024-11-28 DIAGNOSIS — E119 Type 2 diabetes mellitus without complications: Secondary | ICD-10-CM | POA: Diagnosis not present

## 2024-11-28 DIAGNOSIS — E7800 Pure hypercholesterolemia, unspecified: Secondary | ICD-10-CM | POA: Diagnosis not present

## 2024-11-28 DIAGNOSIS — R030 Elevated blood-pressure reading, without diagnosis of hypertension: Secondary | ICD-10-CM | POA: Diagnosis not present

## 2024-11-28 LAB — VITAMIN D 25 HYDROXY (VIT D DEFICIENCY, FRACTURES): VITD: 23.77 ng/mL — ABNORMAL LOW (ref 30.00–100.00)

## 2024-11-28 LAB — COMPREHENSIVE METABOLIC PANEL WITH GFR
ALT: 53 U/L — ABNORMAL HIGH (ref 0–35)
AST: 34 U/L (ref 0–37)
Albumin: 4.8 g/dL (ref 3.5–5.2)
Alkaline Phosphatase: 83 U/L (ref 39–117)
BUN: 12 mg/dL (ref 6–23)
CO2: 28 meq/L (ref 19–32)
Calcium: 10.3 mg/dL (ref 8.4–10.5)
Chloride: 101 meq/L (ref 96–112)
Creatinine, Ser: 0.74 mg/dL (ref 0.40–1.20)
GFR: 95.41 mL/min (ref >60.00)
Glucose, Bld: 131 mg/dL — ABNORMAL HIGH (ref 70–99)
Potassium: 4.1 meq/L (ref 3.5–5.1)
Sodium: 138 meq/L (ref 135–145)
Total Bilirubin: 0.7 mg/dL (ref 0.2–1.2)
Total Protein: 7.1 g/dL (ref 6.0–8.3)

## 2024-11-28 LAB — CBC WITH DIFFERENTIAL/PLATELET
Basophils Absolute: 0 K/uL (ref 0.0–0.1)
Basophils Relative: 0.5 % (ref 0.0–3.0)
Eosinophils Absolute: 0.1 K/uL (ref 0.0–0.7)
Eosinophils Relative: 1.9 % (ref 0.0–5.0)
HCT: 43.8 % (ref 36.0–46.0)
Hemoglobin: 14.6 g/dL (ref 12.0–15.0)
Lymphocytes Relative: 30.3 % (ref 12.0–46.0)
Lymphs Abs: 1.9 K/uL (ref 0.7–4.0)
MCHC: 33.3 g/dL (ref 30.0–36.0)
MCV: 86.8 fl (ref 78.0–100.0)
Monocytes Absolute: 0.6 K/uL (ref 0.1–1.0)
Monocytes Relative: 9.2 % (ref 3.0–12.0)
Neutro Abs: 3.6 K/uL (ref 1.4–7.7)
Neutrophils Relative %: 58.1 % (ref 43.0–77.0)
Platelets: 274 K/uL (ref 150.0–400.0)
RBC: 5.05 Mil/uL (ref 3.87–5.11)
RDW: 13.2 % (ref 11.5–15.5)
WBC: 6.2 K/uL (ref 4.0–10.5)

## 2024-11-28 LAB — VITAMIN B12: Vitamin B-12: 389 pg/mL (ref 211–911)

## 2024-11-28 LAB — HEMOGLOBIN A1C: Hgb A1c MFr Bld: 5.9 % (ref 4.6–6.5)

## 2024-11-28 LAB — LIPID PANEL
Cholesterol: 265 mg/dL — ABNORMAL HIGH (ref 0–200)
HDL: 43.1 mg/dL (ref >39.00)
LDL Cholesterol: 146 mg/dL — ABNORMAL HIGH (ref 0–99)
NonHDL: 222.29
Total CHOL/HDL Ratio: 6
Triglycerides: 380 mg/dL — ABNORMAL HIGH (ref 0.0–149.0)
VLDL: 76 mg/dL — ABNORMAL HIGH (ref 0.0–40.0)

## 2024-11-29 ENCOUNTER — Ambulatory Visit: Payer: Self-pay | Admitting: Physician Assistant

## 2024-11-30 ENCOUNTER — Other Ambulatory Visit (HOSPITAL_BASED_OUTPATIENT_CLINIC_OR_DEPARTMENT_OTHER): Payer: Self-pay

## 2024-11-30 ENCOUNTER — Other Ambulatory Visit: Payer: Self-pay

## 2024-12-03 ENCOUNTER — Other Ambulatory Visit: Payer: Self-pay

## 2024-12-04 ENCOUNTER — Ambulatory Visit: Admitting: Physician Assistant

## 2024-12-04 ENCOUNTER — Other Ambulatory Visit (HOSPITAL_BASED_OUTPATIENT_CLINIC_OR_DEPARTMENT_OTHER): Payer: Self-pay

## 2024-12-04 ENCOUNTER — Other Ambulatory Visit: Payer: Self-pay

## 2024-12-04 ENCOUNTER — Encounter: Payer: Self-pay | Admitting: Physician Assistant

## 2024-12-04 VITALS — BP 120/66 | HR 88 | Temp 97.7°F | Ht 69.5 in | Wt 258.2 lb

## 2024-12-04 DIAGNOSIS — R7989 Other specified abnormal findings of blood chemistry: Secondary | ICD-10-CM | POA: Diagnosis not present

## 2024-12-04 DIAGNOSIS — N951 Menopausal and female climacteric states: Secondary | ICD-10-CM

## 2024-12-04 DIAGNOSIS — Z1211 Encounter for screening for malignant neoplasm of colon: Secondary | ICD-10-CM

## 2024-12-04 DIAGNOSIS — E782 Mixed hyperlipidemia: Secondary | ICD-10-CM | POA: Diagnosis not present

## 2024-12-04 DIAGNOSIS — R809 Proteinuria, unspecified: Secondary | ICD-10-CM

## 2024-12-04 DIAGNOSIS — R632 Polyphagia: Secondary | ICD-10-CM

## 2024-12-04 DIAGNOSIS — E559 Vitamin D deficiency, unspecified: Secondary | ICD-10-CM

## 2024-12-04 DIAGNOSIS — Z6837 Body mass index (BMI) 37.0-37.9, adult: Secondary | ICD-10-CM | POA: Diagnosis not present

## 2024-12-04 DIAGNOSIS — Z0000 Encounter for general adult medical examination without abnormal findings: Secondary | ICD-10-CM | POA: Diagnosis not present

## 2024-12-04 DIAGNOSIS — E669 Obesity, unspecified: Secondary | ICD-10-CM | POA: Diagnosis not present

## 2024-12-04 DIAGNOSIS — E119 Type 2 diabetes mellitus without complications: Secondary | ICD-10-CM | POA: Diagnosis not present

## 2024-12-04 DIAGNOSIS — Z23 Encounter for immunization: Secondary | ICD-10-CM | POA: Diagnosis not present

## 2024-12-04 DIAGNOSIS — F909 Attention-deficit hyperactivity disorder, unspecified type: Secondary | ICD-10-CM

## 2024-12-04 MED ORDER — NYSTATIN 100000 UNIT/GM EX POWD
1.0000 | Freq: Three times a day (TID) | CUTANEOUS | 2 refills | Status: AC
  Filled 2024-12-04: qty 60, 7d supply, fill #0

## 2024-12-04 MED ORDER — DAPAGLIFLOZIN PROPANEDIOL 5 MG PO TABS
5.0000 mg | ORAL_TABLET | Freq: Every day | ORAL | 1 refills | Status: AC
  Filled 2024-12-04 – 2025-01-07 (×2): qty 30, 30d supply, fill #0

## 2024-12-04 MED ORDER — LISDEXAMFETAMINE DIMESYLATE 50 MG PO CAPS
50.0000 mg | ORAL_CAPSULE | Freq: Every day | ORAL | 0 refills | Status: DC
  Filled 2024-12-04: qty 30, 30d supply, fill #0

## 2024-12-04 MED ORDER — GABAPENTIN 300 MG PO CAPS
300.0000 mg | ORAL_CAPSULE | Freq: Every day | ORAL | 2 refills | Status: AC
  Filled 2024-12-04: qty 30, 30d supply, fill #0

## 2024-12-04 MED ORDER — VITAMIN D (ERGOCALCIFEROL) 1.25 MG (50000 UNIT) PO CAPS
50000.0000 [IU] | ORAL_CAPSULE | ORAL | 0 refills | Status: AC
  Filled 2024-12-04: qty 12, 84d supply, fill #0

## 2024-12-04 NOTE — Patient Instructions (Signed)
°  VISIT SUMMARY: Today, we reviewed your overall health during your annual physical exam and discussed your ongoing management of adult ADHD, type 2 diabetes, and binge eating disorder. We also addressed your elevated cholesterol levels, vitamin D  deficiency, perimenopausal symptoms, and liver function. Additionally, we updated your tetanus shot and scheduled necessary screenings.  YOUR PLAN: TYPE 2 DIABETES MELLITUS: Your A1c level has increased from 5.3% to 5.9% over the past nine months. You are currently taking Farxiga  5 mg daily. -Continue taking Farxiga  5 mg daily. -Consider adding cinnamon supplements to help control glucose levels. -Monitor your neuropathy symptoms. If they worsen, we may consider gabapentin .  ADULT ATTENTION-DEFICIT HYPERACTIVITY DISORDER (ADHD): You are currently taking Vyvanse  40 mg for ADHD but are considering an increase in dosage. -Increase Vyvanse  to 50 mg daily. -Monitor for any changes in focus and potential side effects.  BINGE EATING DISORDER: Your binge eating disorder is managed with Vyvanse , and you have reported decreased food noise but increased salt cravings. -Increase Vyvanse  to 50 mg daily. -Monitor for changes in your eating patterns.  MIXED HYPERLIPIDEMIA: Your recent lab results show elevated triglycerides, total cholesterol, and LDL levels. -Continue making dietary changes to reduce cholesterol. -Consider adding flaxseed supplements to your diet. -We will monitor your lipid levels.  VITAMIN D  DEFICIENCY: Your recent lab results indicate low vitamin D  levels. -Take vitamin D  50,000 IU weekly for 12 weeks. -After 12 weeks, switch to over-the-counter vitamin D  2,000-4,000 IU daily.  PERIMENOPAUSAL SYMPTOMS: You are experiencing hot flashes and sleep disturbances, which may be related to perimenopause. -Take gabapentin  300 mg nightly to help with hot flashes and sleep disturbances. -Avoid alcohol while taking gabapentin .  ELEVATED LIVER  FUNCTION TESTS: Your recent lab results show mildly elevated ALT levels. -We will recheck your liver function tests at your next visit. -Avoid alcohol before your next lab test.  GENERAL HEALTH MAINTENANCE: We discussed routine health maintenance and updated your tetanus shot. -Complete the Cologuard test for colon cancer screening. -Attend your scheduled mammogram. -Continue with your routine Pap smear schedule, next due in March next year.                      Contains text generated by [employer].                                 Contains text generated by [employer].

## 2024-12-04 NOTE — Progress Notes (Signed)
 Patient ID: Rachel Curtis, female    DOB: [birth year], 48 y.o.   MRN: [id number]   Assessment & Plan:  Encounter for annual physical exam  Microalbuminuria -     Dapagliflozin  Propanediol; Take 1 tablet (5 mg total) by mouth daily.  Dispense: 90 tablet; Refill: 1  Type 2 diabetes mellitus in patient with obesity (HCC) -     Dapagliflozin  Propanediol; Take 1 tablet (5 mg total) by mouth daily.  Dispense: 90 tablet; Refill: 1  Immunization due -     Tdap vaccine greater than or equal to 7yo IM  Adult ADHD -     Lisdexamfetamine  Dimesylate; Take 1 capsule (50 mg total) by mouth daily.  Dispense: 30 capsule; Refill: 0  Binge eating -     Lisdexamfetamine  Dimesylate; Take 1 capsule (50 mg total) by mouth daily.  Dispense: 30 capsule; Refill: 0  Mixed hyperlipidemia  Vitamin D  deficiency -     Vitamin D  (Ergocalciferol ); Take 1 capsule (50,000 Units total) by mouth every 7 (seven) days.  Dispense: 12 capsule; Refill: 0  Screening for colon cancer -     Cologuard  Perimenopausal symptoms -     Gabapentin ; Take 1 capsule (300 mg total) by mouth at bedtime.  Dispense: 30 capsule; Refill: 2  LFT elevation  Other orders -     Nystatin ; Apply 1 application topically 3 (three) times daily. Apply to affected area for up to 7 days  Dispense: 60 g; Refill: 2    Assessment & Plan Type 2 diabetes mellitus Recent increase in A1c from 5.3% to 5.9% over nine months. Currently managed with Farxiga  5 mg. Reports neuropathy and joint pain, possibly related to Farxiga . Discussed potential benefits of cinnamon supplementation for glucose control. Vitamin D  deficiency may impact glucose levels. - Continue Farxiga  5 mg daily - Consider cinnamon supplementation for glucose control - Monitor neuropathy symptoms and consider gabapentin  if symptoms worsen Lab Results  Component Value Date   HGBA1C 5.9 11/28/2024   HGBA1C 5.3 02/24/2024   HGBA1C 5.7 (A) 12/06/2023    Adult attention-deficit  hyperactivity disorder (ADHD) ADHD managed with Vyvanse  40 mg. Reports no significant difference in focus on weekends without medication. Considering increasing dose to 50 mg to assess efficacy. Discussed potential side effects of gabapentin , including brain fogginess and tiredness. - Increased Vyvanse  to 50 mg daily - Will monitor for changes in focus and side effects - PDMP reviewed today, no red flags, filling appropriately.    Binge eating disorder Managed with Vyvanse . Reports decreased food noise but increased salt cravings. Discussed potential benefits of increasing Vyvanse  dose to address symptoms. - Increased Vyvanse  to 50 mg daily - Monitor for changes in eating patterns  Mixed hyperlipidemia Elevated triglycerides (380 mg/dL), total cholesterol (734 mg/dL), and LDL (853 mg/dL). ASCVD risk score is 3.7%. Discussed dietary modifications and potential use of flaxseed for cholesterol management. Statin therapy considered but not preferred by patient due to family history of statin intolerance. - Continue dietary modifications to reduce cholesterol - Consider flaxseed supplementation - Monitor lipid levels  Vitamin D  deficiency Noted on recent labs. Discussed potential impact on glucose levels and overall health. Plan to increase vitamin D  levels to potentially improve glucose control. - Prescribed vitamin D  50,000 IU weekly for 12 weeks - Follow with over-the-counter vitamin D  2,000-4,000 IU daily after 12 weeks  Perimenopausal symptoms Including hot flashes and sleep disturbances. Discussed potential benefits of gabapentin  for symptom management. Advised to avoid alcohol while taking  gabapentin . - Prescribed gabapentin  300 mg nightly for hot flashes and sleep disturbances - Advised to avoid alcohol while taking gabapentin   Elevated liver function tests Mildly elevated ALT noted on recent labs. Possible contributing factors include recent alcohol consumption and new supplement  use. Plan to monitor liver function tests at next visit. - Will recheck liver function tests at next visit - Advised to avoid alcohol prior to next lab test  General Health Maintenance Routine health maintenance discussed. Tetanus shot updated today. Colon cancer screening and mammogram are overdue. Pap smear is up to date and due in March next year. - Ordered Cologuard for colon cancer screening - Scheduled mammogram - Continue routine Pap smear schedule      Return in about 3 months (around 03/04/2025) for recheck/follow-up.    Subjective:    Chief Complaint  Patient presents with   Annual Exam    Here for annual exam. Blood already drawn before appointment. Wants review results. No questions or concerns for appointment.     HPI Discussed the use of AI scribe software for clinical note transcription with the patient, who gave verbal consent to proceed.  History of Present Illness Rachel Curtis is a 48 year old female who presents for an annual physical exam and follow-up on adult ADHD and binge eating.  She is currently taking Vyvanse  40 mg for adult ADHD. She sometimes skips doses on weekends without noticing a difference and is contemplating whether an increase in dosage might be necessary.  She has type 2 diabetes and is taking Farxiga  5 mg. Her recent lab results show an A1c of 5.9%, up from 5.3% nine months ago. She has experienced neuropathy symptoms, including numbness in one toe and cramping in her calves, which affects her sleep.  Her recent lab work revealed elevated triglycerides at 380 mg/dL, total cholesterol at 734 mg/dL, and LDL at 853 mg/dL. She is trying to improve her diet by avoiding fried and fatty foods. She has a history of elevated triglycerides and occasionally consumes alcohol on weekends, which may affect her lab results.  She reports low vitamin D  levels, which have been low in the past. Her liver function test showed a mildly elevated ALT, which  is new for her. She has been taking 'goalie gummies' for appetite suppression and digestive health, which she started three weeks ago.  She experiences hot flashes and sleep disturbances, which she attributes to perimenopause. She describes the hot flashes as 'crawling up my neck' and causing her to feel hot, especially at night, disrupting her sleep.  She has not completed a colon cancer screening and is overdue for a mammogram. She is up to date with her Pap smear, which is due again in March next year.     Past Medical History:  Diagnosis Date   Allergy    Anxiety    Arthritis 10/21/2019   GERD (gastroesophageal reflux disease)    History of chickenpox     Past Surgical History:  Procedure Laterality Date   CHOLECYSTECTOMY  2001   INTRAUTERINE DEVICE (IUD) INSERTION     mirena  inserted 10-15-21   WISDOM TOOTH EXTRACTION  1999    Family History  Problem Relation Age of Onset   Hyperlipidemia Mother    Hypertension Mother    Lung cancer Mother        hx smoking   Arthritis Mother    Diabetes Father    Heart disease Father    Arthritis Father    Diabetes  Maternal Grandmother    Dementia Paternal Grandmother    Alcohol abuse Paternal Grandfather    Breast cancer Maternal Aunt    Cancer Maternal Uncle        colon ca    Social History[1]   Allergies[2]  Review of Systems NEGATIVE UNLESS OTHERWISE INDICATED IN HPI      Objective:     BP 120/66 (BP Location: Right Arm, Patient Position: Sitting, Cuff Size: Normal)   Pulse 88   Temp 97.7 F (36.5 C) (Temporal)   Ht 5' 9.5 (1.765 m)   Wt 258 lb 3.2 oz (117.1 kg)   LMP  (LMP Unknown)   SpO2 98%   BMI 37.58 kg/m   Wt Readings from Last 3 Encounters:  12/04/24 258 lb 3.2 oz (117.1 kg)  11/01/24 260 lb 3.2 oz (118 kg)  09/04/24 262 lb (118.8 kg)    BP Readings from Last 3 Encounters:  12/04/24 120/66  11/01/24 (!) 138/100  09/04/24 (!) 142/88     Physical Exam Vitals and nursing note reviewed.   Constitutional:      Appearance: Normal appearance. She is obese. She is not toxic-appearing.  HENT:     Head: Normocephalic and atraumatic.     Right Ear: Tympanic membrane, ear canal and external ear normal.     Left Ear: Tympanic membrane, ear canal and external ear normal.     Nose: Nose normal.     Mouth/Throat:     Mouth: Mucous membranes are moist.  Eyes:     Extraocular Movements: Extraocular movements intact.     Conjunctiva/sclera: Conjunctivae normal.     Pupils: Pupils are equal, round, and reactive to light.  Cardiovascular:     Rate and Rhythm: Normal rate and regular rhythm.     Pulses: Normal pulses.     Heart sounds: Normal heart sounds.  Pulmonary:     Effort: Pulmonary effort is normal.     Breath sounds: Normal breath sounds.  Abdominal:     General: Abdomen is flat. Bowel sounds are normal.     Palpations: Abdomen is soft.  Musculoskeletal:        General: Normal range of motion.     Cervical back: Normal range of motion and neck supple.  Skin:    General: Skin is warm and dry.     Findings: No lesion or rash.  Neurological:     General: No focal deficit present.     Mental Status: She is alert and oriented to person, place, and time.  Psychiatric:        Mood and Affect: Mood normal.        Behavior: Behavior normal.        Thought Content: Thought content normal.        Judgment: Judgment normal.        Stephinie Battisti M Shankar Silber, PA-C    [1]  Social History Tobacco Use   Smoking status: Never   Smokeless tobacco: Never  Vaping Use   Vaping status: Never Used  Substance Use Topics   Alcohol use: Yes    Comment: Occasionally   Drug use: Never  [2] No Known Allergies

## 2024-12-05 ENCOUNTER — Other Ambulatory Visit (HOSPITAL_COMMUNITY): Payer: Self-pay

## 2024-12-05 ENCOUNTER — Other Ambulatory Visit (HOSPITAL_BASED_OUTPATIENT_CLINIC_OR_DEPARTMENT_OTHER): Payer: Self-pay

## 2025-01-07 ENCOUNTER — Other Ambulatory Visit: Payer: Self-pay

## 2025-01-07 ENCOUNTER — Other Ambulatory Visit (HOSPITAL_BASED_OUTPATIENT_CLINIC_OR_DEPARTMENT_OTHER): Payer: Self-pay

## 2025-01-07 ENCOUNTER — Other Ambulatory Visit: Payer: Self-pay | Admitting: Physician Assistant

## 2025-01-07 DIAGNOSIS — F909 Attention-deficit hyperactivity disorder, unspecified type: Secondary | ICD-10-CM

## 2025-01-07 DIAGNOSIS — R632 Polyphagia: Secondary | ICD-10-CM

## 2025-01-07 NOTE — Telephone Encounter (Signed)
 Last OV: 12/04/2024  Next OV:  03/13/2025  Last Refill: 12/04/2024  Dispensed: 30/1

## 2025-01-08 ENCOUNTER — Other Ambulatory Visit (HOSPITAL_BASED_OUTPATIENT_CLINIC_OR_DEPARTMENT_OTHER): Payer: Self-pay

## 2025-01-08 MED ORDER — LISDEXAMFETAMINE DIMESYLATE 50 MG PO CAPS
50.0000 mg | ORAL_CAPSULE | Freq: Every day | ORAL | 0 refills | Status: AC
  Filled 2025-01-08: qty 30, 30d supply, fill #0

## 2025-03-13 ENCOUNTER — Ambulatory Visit: Admitting: Physician Assistant
